# Patient Record
Sex: Female | Born: 1990 | Hispanic: Yes | Marital: Married | Smoking: Never smoker
Health system: Southern US, Community
[De-identification: ages and names within clinical notes are randomized; demographics above are authoritative.]

## PROBLEM LIST (undated history)

## (undated) DIAGNOSIS — D649 Anemia, unspecified: Secondary | ICD-10-CM

## (undated) DIAGNOSIS — Z8759 Personal history of other complications of pregnancy, childbirth and the puerperium: Secondary | ICD-10-CM

## (undated) DIAGNOSIS — Z5189 Encounter for other specified aftercare: Secondary | ICD-10-CM

## (undated) DIAGNOSIS — Z8719 Personal history of other diseases of the digestive system: Secondary | ICD-10-CM

## (undated) HISTORY — DX: Anemia, unspecified: D64.9

## (undated) HISTORY — PX: OTHER SURGICAL HISTORY: SHX169

## (undated) HISTORY — DX: Personal history of other diseases of the digestive system: Z87.19

## (undated) HISTORY — DX: Personal history of other complications of pregnancy, childbirth and the puerperium: Z87.59

## (undated) HISTORY — DX: Encounter for other specified aftercare: Z51.89

---

## 2009-11-15 LAB — OB RESULTS CONSOLE VARICELLA ZOSTER ANTIBODY, IGG: Varicella: IMMUNE

## 2009-11-15 LAB — SICKLE CELL SCREEN: Sickle Cell Screen: NORMAL

## 2009-11-15 LAB — OB RESULTS CONSOLE RUBELLA ANTIBODY, IGM: Rubella: IMMUNE

## 2009-11-18 ENCOUNTER — Ambulatory Visit: Payer: Self-pay | Admitting: Family Medicine

## 2009-12-28 ENCOUNTER — Ambulatory Visit: Payer: Self-pay | Admitting: Family Medicine

## 2010-01-12 ENCOUNTER — Ambulatory Visit: Payer: Self-pay | Admitting: Advanced Practice Midwife

## 2010-05-26 ENCOUNTER — Inpatient Hospital Stay: Payer: Self-pay | Admitting: Obstetrics and Gynecology

## 2015-02-02 ENCOUNTER — Ambulatory Visit
Admission: RE | Admit: 2015-02-02 | Discharge: 2015-02-02 | Disposition: A | Discharge status: Home / Self Care | Payer: PRIVATE HEALTH INSURANCE | Source: Ambulatory Visit | Attending: Family Medicine | Admitting: Family Medicine

## 2015-02-02 ENCOUNTER — Other Ambulatory Visit: Payer: Self-pay | Admitting: Family Medicine

## 2015-02-02 DIAGNOSIS — R7611 Nonspecific reaction to tuberculin skin test without active tuberculosis: Secondary | ICD-10-CM

## 2015-02-02 DIAGNOSIS — J9811 Atelectasis: Secondary | ICD-10-CM | POA: Insufficient documentation

## 2015-02-02 DIAGNOSIS — R918 Other nonspecific abnormal finding of lung field: Secondary | ICD-10-CM | POA: Insufficient documentation

## 2015-02-02 LAB — HM HIV SCREENING LAB: HM HIV Screening: NEGATIVE

## 2015-04-15 DIAGNOSIS — R7611 Nonspecific reaction to tuberculin skin test without active tuberculosis: Secondary | ICD-10-CM | POA: Insufficient documentation

## 2017-07-18 DIAGNOSIS — Z8759 Personal history of other complications of pregnancy, childbirth and the puerperium: Secondary | ICD-10-CM | POA: Insufficient documentation

## 2017-07-18 LAB — HM PAP SMEAR: HM Pap smear: NEGATIVE

## 2019-03-12 ENCOUNTER — Other Ambulatory Visit: Payer: Self-pay

## 2019-03-12 ENCOUNTER — Ambulatory Visit (LOCAL_COMMUNITY_HEALTH_CENTER): Payer: Self-pay

## 2019-03-12 VITALS — BP 107/64 | Ht 61.5 in | Wt 120.0 lb

## 2019-03-12 DIAGNOSIS — Z3201 Encounter for pregnancy test, result positive: Secondary | ICD-10-CM

## 2019-03-12 LAB — PREGNANCY, URINE: Preg Test, Ur: POSITIVE — AB

## 2019-03-12 MED ORDER — PRENATAL VITAMIN 27-0.8 MG PO TABS: 1.0000 | ORAL_TABLET | Freq: Every day | ORAL | 0 refills | Status: AC

## 2019-04-15 ENCOUNTER — Telehealth: Payer: Self-pay

## 2019-04-15 NOTE — Telephone Encounter (Signed)
Via interpreter M. [place]-attempted to call to f/u on +PT and care provider; phone # not working Sharlette Dense, Charity fundraiser

## 2019-04-16 DIAGNOSIS — R7611 Nonspecific reaction to tuberculin skin test without active tuberculosis: Secondary | ICD-10-CM

## 2019-04-16 DIAGNOSIS — Z8759 Personal history of other complications of pregnancy, childbirth and the puerperium: Secondary | ICD-10-CM

## 2019-04-17 ENCOUNTER — Other Ambulatory Visit: Payer: Self-pay

## 2019-04-17 ENCOUNTER — Encounter: Payer: Self-pay | Admitting: Family Medicine

## 2019-04-17 ENCOUNTER — Ambulatory Visit: Payer: Medicaid Other | Admitting: Family Medicine

## 2019-04-17 VITALS — BP 103/42 | HR 82 | Temp 97.0°F | Wt 117.4 lb

## 2019-04-17 DIAGNOSIS — O219 Vomiting of pregnancy, unspecified: Secondary | ICD-10-CM | POA: Insufficient documentation

## 2019-04-17 DIAGNOSIS — Z3480 Encounter for supervision of other normal pregnancy, unspecified trimester: Secondary | ICD-10-CM | POA: Insufficient documentation

## 2019-04-17 DIAGNOSIS — Z9889 Other specified postprocedural states: Secondary | ICD-10-CM

## 2019-04-17 LAB — HIV ANTIBODY (ROUTINE TESTING W REFLEX): HIV 1&2 Ab, 4th Generation: NONREACTIVE

## 2019-04-17 LAB — URINALYSIS
Bilirubin, UA: NEGATIVE
Glucose, UA: NEGATIVE
Ketones, UA: NEGATIVE
Leukocytes,UA: NEGATIVE
Nitrite, UA: NEGATIVE
Protein,UA: NEGATIVE
RBC, UA: NEGATIVE
Specific Gravity, UA: 1.025 (ref 1.005–1.030)
Urobilinogen, Ur: 0.2 mg/dL (ref 0.2–1.0)
pH, UA: 6.5 (ref 5.0–7.5)

## 2019-04-17 LAB — HEMOGLOBIN, FINGERSTICK: Hemoglobin: 11.1 g/dL (ref 11.1–15.9)

## 2019-04-17 MED ORDER — PROMETHAZINE HCL 25 MG PO TABS: 25.0000 mg | ORAL_TABLET | Freq: Four times a day (QID) | ORAL | 2 refills | Status: AC | PRN

## 2019-04-17 NOTE — Progress Notes (Signed)
[place] [id number] [phone]  INITIAL PRENATAL VISIT NOTE  Subjective:  Jessica Savage is a 29 y.o. G3P1011 at [redacted]w[redacted]d being seen today to start prenatal care at the [facility] Department.  She is currently monitored for the following issues for this low-risk pregnancy and has History of postpartum hemorrhage; PPD positive; and Supervision of other normal pregnancy, antepartum on their problem list.  Patient reports no complaints. She is happy about this planned pregnancy. She is nervous because she experienced a prior SAB and is worried this will happen again. She is experiencing significant nausea and vomits about 2-3 times per day  Contractions: Not present. Vag. Bleeding: None.  Movement: Absent. Denies leaking of fluid.   Indications for ASA therapy (per uptodate) One of the following: Previous pregnancy with preeclampsia, especially early onset and with an adverse outcome No Multifetal gestation No Chronic hypertension No Type 1 or 2 diabetes mellitus No Chronic kidney disease No Autoimmune disease (antiphospholipid syndrome, systemic lupus erythematosus) No  Two or more of the following: Nulliparity No Obesity (body mass index >30 kg/m2) No Family history of preeclampsia in mother or sister No Age ?35 years No Sociodemographic characteristics (African American race, low socioeconomic level) Yes Personal risk factors (eg, previous pregnancy with low birth weight or small for gestational age infant, previous adverse pregnancy outcome [eg, stillbirth], interval >10 years between pregnancies) No   The following portions of the patient's history were reviewed and updated as appropriate: allergies, current medications, past family history, past medical history, past social history, past surgical history and problem list. Problem list updated.  Objective:   Vitals:    04/17/19 1320  BP: (!) 103/42  Pulse: 82  Temp: (!) 97 F (36.1 C)  Weight: 117 lb 6.4 oz (53.3 kg)    Fetal Status: Fetal Heart Rate (bpm): 150 Fundal Height: 12 cm Movement: Absent     Physical Exam Vitals and nursing note reviewed.  Constitutional:      General: She is not in acute distress.    Appearance: Normal appearance. She is well-developed.  HENT:     Head: Normocephalic and atraumatic.     Right Ear: External ear normal.     Left Ear: External ear normal.     Nose: Nose normal. No congestion or rhinorrhea.     Mouth/Throat:     Lips: Pink.     Mouth: Mucous membranes are moist.     Dentition: Normal dentition. No dental caries.     Pharynx: Oropharynx is clear. Uvula midline.  Eyes:     General: No scleral icterus.    Conjunctiva/sclera: Conjunctivae normal.  Neck:     Thyroid: No thyroid mass or thyromegaly.  Cardiovascular:     Rate and Rhythm: Normal rate.     Pulses: Normal pulses.     Comments: Extremities are warm and well perfused Pulmonary:     Effort: Pulmonary effort is normal.     Breath sounds: Normal breath sounds.  Chest:     Breasts: Breasts are symmetrical.        Right: Inverted nipple present. No mass, nipple discharge or skin change.        Left: Inverted nipple present. No mass, nipple discharge or skin change.    Abdominal:     General: Abdomen is flat.     Palpations: Abdomen is soft.     Tenderness: There is no abdominal  tenderness.     Comments: Gravid   Genitourinary:    General: Normal vulva.     Exam position: Lithotomy position.     Pubic Area: No rash.      Labia:        Right: No rash.        Left: No rash.      Vagina: Normal. No vaginal discharge.     Cervix: No cervical motion tenderness or friability.     Uterus: Normal. Enlarged (Gravid 12wk size). Not tender.      Adnexa: Right adnexa normal and left adnexa normal.     Rectum: Normal. No external hemorrhoid.  Musculoskeletal:     Right lower leg: No edema.      Left lower leg: No edema.  Lymphadenopathy:     Upper Body:     Right upper body: No axillary adenopathy.     Left upper body: No axillary adenopathy.  Skin:    General: Skin is warm.     Capillary Refill: Capillary refill takes less than 2 seconds.  Neurological:     Mental Status: She is alert.     Assessment and Plan:  Pregnancy: G3P1011 at [redacted]w[redacted]d  1. Supervision of other normal pregnancy, antepartum - Reviewed care at [facility] with team and then delivery at [facility] with local OB practices or [facility].  - Reviewed cadence of care in pregnancy  - reviewed risk factors and ASA not indicated - Pap needed at [facility] visit (after 07/2020) - HIV [place] [facility] - Prenatal profile without Varicella/Rubella (220254) - Urine Culture - Chlamydia/GC NAA, Confirmation - Lead, blood (adult age 60 yrs or greater) - Hemoglobin, venipuncture - Urinalysis (Urine Dip)  Reviewed Centering pregnancy as standard of care at [facility]- not enrolling spanish groups currently  2. Nausea Reviewed OTC medications for nausea Rx for phenergan sent to pharmacy  Preterm labor symptoms and general obstetric precautions including but not limited to vaginal bleeding, contractions, leaking of fluid and fetal movement were reviewed in detail with the patient.  Please refer to After Visit Summary for other counseling recommendations.   Return in about 4 weeks (around 05/15/2019) for Routine prenatal care, in person.  No future appointments.  Federico Flake, MD

## 2019-04-17 NOTE — Progress Notes (Addendum)
Here today for 12.0 week MH IP appt. Taking PNV QD, denies ED/hospital visits since +PT. Declines Flu vaccine. Tawny Hopping, RN

## 2019-04-18 ENCOUNTER — Other Ambulatory Visit: Payer: Self-pay | Admitting: Family Medicine

## 2019-04-18 LAB — CBC/D/PLT+RPR+RH+ABO+AB SCR
Antibody Screen: NEGATIVE
Basophils Absolute: 0 10*3/uL (ref 0.0–0.2)
Basos: 0 %
EOS (ABSOLUTE): 0.1 10*3/uL (ref 0.0–0.4)
Eos: 2 %
Hematocrit: 35 % (ref 34.0–46.6)
Hemoglobin: 11.3 g/dL (ref 11.1–15.9)
Hepatitis B Surface Ag: NEGATIVE
Immature Grans (Abs): 0 10*3/uL (ref 0.0–0.1)
Immature Granulocytes: 0 %
Lymphocytes Absolute: 2 10*3/uL (ref 0.7–3.1)
Lymphs: 35 %
MCH: 29 pg (ref 26.6–33.0)
MCHC: 32.3 g/dL (ref 31.5–35.7)
MCV: 90 fL (ref 79–97)
Monocytes Absolute: 0.4 10*3/uL (ref 0.1–0.9)
Monocytes: 8 %
Neutrophils Absolute: 3.1 10*3/uL (ref 1.4–7.0)
Neutrophils: 55 %
Platelets: 240 10*3/uL (ref 150–450)
RBC: 3.9 x10E6/uL (ref 3.77–5.28)
RDW: 18.2 % — ABNORMAL HIGH (ref 11.7–15.4)
RPR Ser Ql: NONREACTIVE
Rh Factor: POSITIVE
WBC: 5.6 10*3/uL (ref 3.4–10.8)

## 2019-04-18 LAB — LEAD, BLOOD (ADULT >= 16 YRS): Lead-Whole Blood: <1 ug/dL (ref 0–4)

## 2019-04-19 LAB — URINE CULTURE: Organism ID, Bacteria: NO GROWTH

## 2019-04-21 ENCOUNTER — Other Ambulatory Visit: Payer: Self-pay | Admitting: Family Medicine

## 2019-04-21 DIAGNOSIS — Z3689 Encounter for other specified antenatal screening: Secondary | ICD-10-CM

## 2019-04-21 LAB — CHLAMYDIA/GC NAA, CONFIRMATION
Chlamydia trachomatis, NAA: NEGATIVE
Neisseria gonorrhoeae, NAA: NEGATIVE

## 2019-04-23 ENCOUNTER — Telehealth: Payer: Self-pay

## 2019-04-23 NOTE — Telephone Encounter (Signed)
Call to pt via interpreter V. Olmedo; informed of DP GC & u/s appt 05/29/19 @ 9:45 Sharlette Dense, RN

## 2019-04-23 NOTE — Progress Notes (Signed)
HIV results abstracted. Yamaira Spinner, RN  

## 2019-05-14 ENCOUNTER — Ambulatory Visit: Payer: Medicaid Other | Admitting: Advanced Practice Midwife

## 2019-05-14 ENCOUNTER — Other Ambulatory Visit: Payer: Self-pay

## 2019-05-14 DIAGNOSIS — Z3480 Encounter for supervision of other normal pregnancy, unspecified trimester: Secondary | ICD-10-CM

## 2019-05-14 NOTE — Progress Notes (Signed)
   PRENATAL VISIT NOTE  Subjective:  Jessica Savage is a 29 y.o. G3P1011 at [redacted]w[redacted]d being seen today for ongoing prenatal care.  She is currently monitored for the following issues for this low-risk pregnancy and has History of postpartum hemorrhage; PPD positive; Supervision of other normal pregnancy, antepartum; History of breast surgery; and Nausea and vomiting in pregnancy on their problem list.  Patient reports no complaints.  Contractions: Not present. Vag. Bleeding: None.  Movement: Absent. Denies leaking of fluid/ROM.   The following portions of the patient's history were reviewed and updated as appropriate: allergies, current medications, past family history, past medical history, past social history, past surgical history and problem list. Problem list updated.  Objective:   Vitals:   05/14/19 1608  BP: (!) 99/44  Pulse: 76  Temp: (!) 97.3 F (36.3 C)  Weight: 121 lb 9.6 oz (55.2 kg)    Fetal Status:   Fundal Height: 18 cm Movement: Absent     General:  Alert, oriented and cooperative. Patient is in no acute distress.  Skin: Skin is warm and dry. No rash noted.   Cardiovascular: Normal heart rate noted  Respiratory: Normal respiratory effort, no problems with respiration noted  Abdomen: Soft, gravid, appropriate for gestational age.  Pain/Pressure: Absent     Pelvic: Cervical exam deferred        Extremities: Normal range of motion.  Edema: None  Mental Status: Normal mood and affect. Normal behavior. Normal judgment and thought content.   Assessment and Plan:  Pregnancy: G3P1011 at [redacted]w[redacted]d  1. Supervision of other normal pregnancy, antepartum Feels well.  Here with FOB and daughter.  Working 40 hrs/wk.  Desires Quad screen today.  Reminded of GC and u/s at [facility] on 05/29/19. - QUAD Screen [facility] Only   Preterm labor symptoms and general obstetric precautions including but not limited to vaginal bleeding, contractions, leaking of fluid and fetal movement were  reviewed in detail with the patient. Please refer to After Visit Summary for other counseling recommendations.  No follow-ups on file.  Future Appointments  Date Time Provider Department Center  05/29/2019 10:00 AM [facility] None  05/29/2019 11:00 AM [facility] [place] [facility]    Alberteen Spindle, CNM

## 2019-05-14 NOTE — Progress Notes (Signed)
In for visit; denies hospital visits since last appt; taking PNV; aware of 05/29/19 [facility] appts; discussed Quad screen-desires today Sharlette Dense, RN

## 2019-05-29 ENCOUNTER — Other Ambulatory Visit: Payer: Self-pay

## 2019-05-29 ENCOUNTER — Ambulatory Visit
Admission: RE | Admit: 2019-05-29 | Discharge: 2019-05-29 | Disposition: A | Discharge status: Home / Self Care | Payer: BC Managed Care – PPO | Source: Ambulatory Visit | Attending: Family Medicine | Admitting: Family Medicine

## 2019-05-29 ENCOUNTER — Ambulatory Visit (HOSPITAL_BASED_OUTPATIENT_CLINIC_OR_DEPARTMENT_OTHER)
Admission: RE | Admit: 2019-05-29 | Discharge: 2019-05-29 | Disposition: A | Discharge status: Home / Self Care | Payer: BC Managed Care – PPO | Source: Ambulatory Visit | Attending: Obstetrics and Gynecology | Admitting: Obstetrics and Gynecology

## 2019-05-29 VITALS — BP 101/59 | HR 96 | Temp 97.9°F | Wt 124.0 lb

## 2019-05-29 DIAGNOSIS — Z3A18 18 weeks gestation of pregnancy: Secondary | ICD-10-CM | POA: Diagnosis not present

## 2019-05-29 DIAGNOSIS — Z3689 Encounter for other specified antenatal screening: Secondary | ICD-10-CM | POA: Diagnosis not present

## 2019-05-29 DIAGNOSIS — Z36 Encounter for antenatal screening for chromosomal anomalies: Secondary | ICD-10-CM | POA: Diagnosis not present

## 2019-05-29 DIAGNOSIS — O43199 Other malformation of placenta, unspecified trimester: Secondary | ICD-10-CM | POA: Insufficient documentation

## 2019-05-29 NOTE — Progress Notes (Signed)
Referring physician:  [facility] Department Length of Consultation: 20 minutes   Ms. Jessica Savage  was referred to [place] for genetic counseling to review prenatal screening and testing options.  This note summarizes the information we discussed.    We discussed the following routine screening tests for this pregnancy:  Maternal serum marker screening, a blood test that measures pregnancy proteins, can provide risk assessments for Down syndrome, trisomy 18, and open neural tube defects (spina bifida, anencephaly). Because it does not directly examine the fetus, it cannot positively diagnose or rule out these problems. Maternal serum screening was previously performed at [facility] and is within normal range.  The chance for Down syndrome was reduced to 1 in 2,140, the chance for open spina bifida is 1 in 5,320 and the chance for trisomy 18 was not increased.  Targeted ultrasound uses high frequency sound waves to create an image of the developing fetus.  An ultrasound is often recommended as a routine means of evaluating the pregnancy.  It is also used to screen for fetal anatomy problems (for example, a heart defect) that might be suggestive of a chromosomal or other abnormality. An ultrasound at the time of this visit showed normal fetal anatomy at [redacted] weeks gestation. See that note for details.  Should these screening tests indicate an increased concern, then the following additional testing options would be offered:  Amniocentesis involves the removal of a small amount of amniotic fluid from the sac surrounding the fetus with the use of a thin needle inserted through the maternal abdomen and uterus.  Ultrasound guidance is used throughout the procedure.  Fetal cells from amniotic fluid are directly evaluated and > 99.5% of chromosome problems and > 98% of open neural tube defects can be detected. This procedure is generally performed after the 15th week of  pregnancy.  The main risks to this procedure include complications leading to miscarriage in less than 1 in 200 cases (0.5%).  As another option for information if the pregnancy is suspected to be an an increased chance for certain chromosome conditions, we also reviewed the availability of cell free fetal DNA testing from maternal blood to determine whether or not the baby may have either Down syndrome, trisomy 80, or trisomy 11.  This test utilizes a maternal blood sample and DNA sequencing technology to isolate circulating cell free fetal DNA from maternal plasma.  The fetal DNA can then be analyzed for DNA sequences that are derived from the three most common chromosomes involved in aneuploidy, chromosomes 13, 18, and 21.  If the overall amount of DNA is greater than the expected level for any of these chromosomes, aneuploidy is suspected.  While we do not consider it a replacement for invasive testing and karyotype analysis, a negative result from this testing would be reassuring, though not a guarantee of a normal chromosome complement for the baby.  An abnormal result is certainly suggestive of an abnormal chromosome complement, though we would still recommend CVS or amniocentesis to confirm any findings from this testing.  Cystic Fibrosis and Spinal Muscular Atrophy (SMA) screening were also discussed with the patient. Both conditions are recessive, which means that both parents must be carriers in order to have a child with the disease.  Cystic fibrosis (CF) is one of the most common genetic conditions in persons of Caucasian ancestry.  This condition occurs in approximately 1 in 2,500 Caucasian persons and results in thickened secretions in the lungs, digestive,  and reproductive systems.  For a baby to be at risk for having CF, both of the parents must be carriers for this condition.  Approximately 1 in 38 Caucasian persons is a carrier for CF.  Current carrier testing looks for the most common  mutations in the gene for CF and can detect approximately 90% of carriers in the Caucasian population.  This means that the carrier screening can greatly reduce, but cannot eliminate, the chance for an individual to have a child with CF.  If an individual is found to be a carrier for CF, then carrier testing would be available for the partner. As part of [place]'s newborn screening profile, all babies born in the state of [place] will have a two-tier screening process.  Specimens are first tested to determine the concentration of immunoreactive trypsinogen (IRT).  The top 5% of specimens with the highest IRT values then undergo DNA testing using a panel of over 40 common CF mutations. SMA is a neurodegenerative disorder that leads to atrophy of skeletal muscle and overall weakness.  This condition is also more prevalent in the Caucasian population, with 1 in 40-1 in 60 persons being a carrier and 1 in 6,000-1 in 10,000 children being affected.  There are multiple forms of the disease, with some causing death in infancy to other forms with survival into adulthood.  The genetics of SMA is complex, but carrier screening can detect up to 95% of carriers in the Caucasian population.  Similar to CF, a negative result can greatly reduce, but cannot eliminate, the chance to have a child with SMA. Hemoglobinopathy screening was also made available.  We inquire about the family history and pregnancy history.  The patient reported no complications or exposures in this pregnancy to medications, alcohol, tobacco or recreational drugs.  The family history was reported to be unremarkable for birth defects, intellectual delays, recurrent pregnancy loss or known chromosome abnormalities.  After consideration of the options, Ms. Jessica Savage declined carrier screening for CF, SMA and hemoglobinopathies.  Ms. Jessica Savage was encouraged to call with questions or concerns.  We can be contacted at [phone].  Labs ordered: none  Cherly Anderson, MS, CGC

## 2019-06-02 ENCOUNTER — Encounter: Payer: Self-pay | Admitting: Family Medicine

## 2019-06-02 DIAGNOSIS — Z3480 Encounter for supervision of other normal pregnancy, unspecified trimester: Secondary | ICD-10-CM

## 2019-06-11 ENCOUNTER — Other Ambulatory Visit: Payer: Self-pay

## 2019-06-11 ENCOUNTER — Ambulatory Visit: Payer: Medicaid Other | Admitting: Advanced Practice Midwife

## 2019-06-11 VITALS — BP 107/64 | HR 86 | Temp 97.8°F | Wt 127.8 lb

## 2019-06-11 DIAGNOSIS — O219 Vomiting of pregnancy, unspecified: Secondary | ICD-10-CM

## 2019-06-11 DIAGNOSIS — O43199 Other malformation of placenta, unspecified trimester: Secondary | ICD-10-CM

## 2019-06-11 DIAGNOSIS — Z3480 Encounter for supervision of other normal pregnancy, unspecified trimester: Secondary | ICD-10-CM

## 2019-06-11 NOTE — Progress Notes (Signed)
Here today for 19.6 week MH RV. Taking PNV QD and needs Rx sent for same sent to pharmacy in chart. Denies ED/hosptial visits since last RV. Tawny Hopping, RN

## 2019-06-11 NOTE — Progress Notes (Signed)
   PRENATAL VISIT NOTE  Subjective:  Jessica Savage is a 29 y.o. G3P1011 at [redacted]w[redacted]d being seen today for ongoing prenatal care.  She is currently monitored for the following issues for this low-risk pregnancy and has History of postpartum hemorrhage; PPD positive; Supervision of other normal pregnancy, antepartum; History of breast surgery; Nausea and vomiting in pregnancy; Encounter for antenatal screening for chromosomal anomalies; and Marginal insertion of umbilical cord affecting management of mother on their problem list.  Patient reports no complaints.  Contractions: Not present. Vag. Bleeding: None.  Movement: Present. Denies leaking of fluid/ROM.   The following portions of the patient's history were reviewed and updated as appropriate: allergies, current medications, past family history, past medical history, past social history, past surgical history and problem list. Problem list updated.  Objective:   Vitals:   06/11/19 1612  BP: 107/64  Pulse: 86  Temp: 97.8 F (36.6 C)  Weight: 127 lb 12.8 oz (58 kg)    Fetal Status: Fetal Heart Rate (bpm): 140 Fundal Height: 20 cm Movement: Present     General:  Alert, oriented and cooperative. Patient is in no acute distress.  Skin: Skin is warm and dry. No rash noted.   Cardiovascular: Normal heart rate noted  Respiratory: Normal respiratory effort, no problems with respiration noted  Abdomen: Soft, gravid, appropriate for gestational age.  Pain/Pressure: Absent     Pelvic: Cervical exam deferred        Extremities: Normal range of motion.  Edema: None  Mental Status: Normal mood and affect. Normal behavior. Normal judgment and thought content.   Assessment and Plan:  Pregnancy: G3P1011 at [redacted]w[redacted]d  1. Supervision of other normal pregnancy, antepartum Quad screen from 05/14/19 neg.  Working 40 hrs/wk.  Here with FOB and daughter.   States she doesn't need to exercise because she is active at [facility] [employer] [facility] [employer] needs to exercise 4x/wk  x 20-30 min Pt now changes mind and wants to deliver at [facility]  2. Marginal insertion of umbilical cord affecting management of mother Reviewed 05/29/19 u/s with normal anatomy, AFI wnl, EFW=63%,  No previa, posterior placenta, marginal cord insertion at fundal portion of placenta.  Recommend third trimester growth u/s  3. Nausea and vomiting in pregnancy Pt states still has N&V and taking Phenergan qhs.  Last vomited 3 wks ago.  7 lb 12.8 oz (3.538 kg)  Counseled pt to eat high protein snacks q 2 hours and try ginger tea/chamomile tea at night instead of Phenergan.       Preterm labor symptoms and general obstetric precautions including but not limited to vaginal bleeding, contractions, leaking of fluid and fetal movement were reviewed in detail with the patient. Please refer to After Visit Summary for other counseling recommendations.  Return in about 4 weeks (around 07/09/2019) for routine PNC.  Future Appointments  Date Time Provider Department Center  07/10/2019  4:00 PM [facility] PROVIDER [facility] None  08/21/2019  9:00 AM [facility] [place] [facility]    Alberteen Spindle, CNM

## 2019-07-10 ENCOUNTER — Other Ambulatory Visit: Payer: Self-pay

## 2019-07-10 ENCOUNTER — Ambulatory Visit: Payer: BC Managed Care – PPO | Admitting: Physician Assistant

## 2019-07-10 VITALS — BP 94/56 | HR 82 | Temp 97.6°F | Wt 135.6 lb

## 2019-07-10 DIAGNOSIS — O43199 Other malformation of placenta, unspecified trimester: Secondary | ICD-10-CM

## 2019-07-10 DIAGNOSIS — Z3480 Encounter for supervision of other normal pregnancy, unspecified trimester: Secondary | ICD-10-CM

## 2019-07-10 NOTE — Progress Notes (Signed)
   PRENATAL VISIT NOTE  Subjective:  Jessica Savage is a 29 y.o. G3P1011 at [redacted]w[redacted]d being seen today for ongoing prenatal care.  She is currently monitored for the following issues for this low-risk pregnancy and has History of postpartum hemorrhage; PPD positive; Supervision of other normal pregnancy, antepartum; History of breast surgery; Nausea and vomiting in pregnancy; Encounter for antenatal screening for chromosomal anomalies; and Marginal insertion of umbilical cord affecting management of mother on their problem list.  Patient reports no complaints.  Contractions: Not present. Vag. Bleeding: None.  Movement: Present. Denies leaking of fluid/ROM.   The following portions of the patient's history were reviewed and updated as appropriate: allergies, current medications, past family history, past medical history, past social history, past surgical history and problem list. Problem list updated.  Objective:   Vitals:   07/10/19 1621  BP: (!) 94/56  Pulse: 82  Temp: 97.6 F (36.4 C)  Weight: 135 lb 9.6 oz (61.5 kg)    Fetal Status: Fetal Heart Rate (bpm): 140 Fundal Height: 25 cm Movement: Present     General:  Alert, oriented and cooperative. Patient is in no acute distress.  Skin: Skin is warm and dry. No rash noted.   Cardiovascular: Normal heart rate noted  Respiratory: Normal respiratory effort, no problems with respiration noted  Abdomen: Soft, gravid, appropriate for gestational age.  Pain/Pressure: Absent     Pelvic: Cervical exam deferred        Extremities: Normal range of motion.  Edema: None  Mental Status: Normal mood and affect. Normal behavior. Normal judgment and thought content.   Assessment and Plan:  Pregnancy: G3P1011 at [redacted]w[redacted]d  1. Supervision of other normal pregnancy, antepartum Anticipatory guidance re: 28-wk labs at RV.  2. Marginal insertion of umbilical cord affecting management of mother Reviewed 05/29/19 [place] result with pt/husb. Plan to repeat US  for fetal growth in third trim. No indication for work restrictions at present (lifts 15 lb at work.). Note family planning 1-mo visit to family in [place] by car during pregnancy. Enc to do so by first part of 3rd trim, can give paper copy of prenatal record to take on trip.   Preterm labor symptoms and general obstetric precautions including but not limited to vaginal bleeding, contractions, leaking of fluid and fetal movement were reviewed in detail with the patient. Please refer to After Visit Summary for other counseling recommendations.  Return in about 3 weeks (around 07/31/2019) for Routine prenatal care, 28 wk labs (needs visit before 10:30am or 3pm).  Future Appointments  Date Time Provider Department Center  07/31/2019  4:00 PM [facility] PROVIDER [facility] None  08/21/2019  9:00 AM [facility] [place] [facility]    Annamarie Streilein, PA-C

## 2019-07-15 ENCOUNTER — Other Ambulatory Visit: Payer: Self-pay

## 2019-07-15 ENCOUNTER — Ambulatory Visit: Payer: BC Managed Care – PPO | Admitting: Family Medicine

## 2019-07-15 VITALS — BP 104/65 | HR 87 | Temp 98.3°F | Wt 136.2 lb

## 2019-07-15 DIAGNOSIS — Z3480 Encounter for supervision of other normal pregnancy, unspecified trimester: Secondary | ICD-10-CM

## 2019-07-15 DIAGNOSIS — L299 Pruritus, unspecified: Secondary | ICD-10-CM

## 2019-07-15 MED ORDER — PERMETHRIN 5 % EX CREA: TOPICAL_CREAM | CUTANEOUS | 1 refills | Status: AC

## 2019-07-15 NOTE — Progress Notes (Signed)
Walked in today at 24.[redacted] weeks gestation. Would like to discuss with provider concerns of itching all over her body for 4 weeks. Taking PNV QD. Denies ED/hospital visits since last RV of 07/10/2019. Next RV appt scheduled for 07/31/19. Tawny Hopping, RN

## 2019-07-15 NOTE — Progress Notes (Signed)
°  PRENATAL VISIT NOTE  Subjective:  Jessica Savage is a 29 y.o. G3P1011 at [redacted]w[redacted]d being seen today for ongoing prenatal care.  She is currently monitored for the following issues for this low-risk pregnancy and has History of postpartum hemorrhage; PPD positive; Supervision of other normal pregnancy, antepartum; History of breast surgery; Nausea and vomiting in pregnancy; Encounter for antenatal screening for chromosomal anomalies; and Marginal insertion of umbilical cord affecting management of mother on their problem list.  Pt presents for walk-in visit d/t itching x4 wks. She has noticed small lesions where she itches frequently. Worse on extremities, none on abdomen. Worse in daytime. She shares a bed with husband and daughter, daughter has one area on leg that also itches. She forgot to mention this at last visit. Reports good fetal mov't.    Contractions: Not present. Vag. Bleeding: None.  Movement: Present. Denies leaking of fluid/ROM.   The following portions of the patient's history were reviewed and updated as appropriate: allergies, current medications, past family history, past medical history, past social history, past surgical history and problem list. Problem list updated.  Objective:   Vitals:   07/15/19 1634  BP: 104/65  Pulse: 87  Temp: 98.3 F (36.8 C)  Weight: 136 lb 3.2 oz (61.8 kg)    Fetal Status: Fetal Heart Rate (bpm): 145 Fundal Height: 25 cm Movement: Present     General:  Alert, oriented and cooperative. Patient is in no acute distress.  Skin: Skin is warm and dry. +scattered pinpoint lesions on arms and legs. +signs of excoriation. No erythema or other rashes, no jaundice. No sclera icterus.  Cardiovascular: Normal heart rate noted  Respiratory: Normal respiratory effort, no problems with respiration noted  Abdomen: Soft, gravid, appropriate for gestational age.  Pain/Pressure: Absent     Pelvic: Cervical exam deferred        Extremities: Normal range of  motion.  Edema: None  Mental Status: Normal mood and affect. Normal behavior. Normal judgment and thought content.   Assessment and Plan:  Pregnancy: G3P1011 at [redacted]w[redacted]d  1. Itching -Pinpoint lesions suggestive of scabies. Rx permethrin, advised to use tomorrow. If no improvement or if symptoms worsen to RTC for further investigation. - permethrin (ELIMITE) 5 % cream; Apply from neck down to the soles of the feet, wash off after 8-14 hours. If symptoms improve but fail to resolve completely repeat after 10 days.  Dispense: 60 g; Refill: 1   Preterm labor symptoms and general obstetric precautions including but not limited to vaginal bleeding, contractions, leaking of fluid and fetal movement were reviewed in detail with the patient. Please refer to After Visit Summary for other counseling recommendations.  Return in about 2 weeks (around 07/29/2019) for routine prenatal care as regularly scheduled.  Future Appointments  Date Time Provider Department Center  07/31/2019  4:00 PM [facility] PROVIDER [facility] None  08/21/2019  9:00 AM [facility] [place] [facility]    Ann Held, PA-C

## 2019-07-16 ENCOUNTER — Other Ambulatory Visit: Payer: Self-pay

## 2019-07-16 ENCOUNTER — Encounter: Payer: Self-pay | Admitting: Family Medicine

## 2019-07-31 ENCOUNTER — Ambulatory Visit: Payer: Self-pay

## 2019-07-31 ENCOUNTER — Telehealth: Payer: Self-pay

## 2019-07-31 NOTE — Telephone Encounter (Signed)
Attempted to call via interpreter [phone] to reschedule from today due to provider illness & will also need an earlier time due to labs needed @ visit; no answer, left voicemail message via interpreter Sharlette Dense, RN

## 2019-08-01 ENCOUNTER — Ambulatory Visit: Payer: BC Managed Care – PPO

## 2019-08-04 ENCOUNTER — Ambulatory Visit: Payer: BC Managed Care – PPO

## 2019-08-14 ENCOUNTER — Other Ambulatory Visit: Payer: Self-pay | Admitting: Family Medicine

## 2019-08-14 DIAGNOSIS — O43199 Other malformation of placenta, unspecified trimester: Secondary | ICD-10-CM

## 2019-08-21 ENCOUNTER — Inpatient Hospital Stay
Admission: RE | Admit: 2019-08-21 | Discharge: 2019-08-21 | Disposition: A | Discharge status: Home / Self Care | Payer: PRIVATE HEALTH INSURANCE | Source: Ambulatory Visit

## 2019-08-21 NOTE — Progress Notes (Signed)
Pt was a "No Show" at Maternal Fetal Care at [facility] today.

## 2019-08-26 ENCOUNTER — Telehealth: Payer: Self-pay

## 2019-08-26 NOTE — Telephone Encounter (Signed)
Attempted to call via interpreter M. [place]-client came in to [facility] 07/31/19 and picked up copy of records to "go out of the country"; attempted to call to inquire if will be returning and to schedule follow-up; no answer, left voicemail message Sharlette Dense, RN

## 2019-08-28 NOTE — Telephone Encounter (Signed)
Attempt x 2 to reach client, via interpreter  V. Olmedo-left voicemail message Sharlette Dense, RN

## 2019-09-15 ENCOUNTER — Telehealth: Payer: Self-pay

## 2019-09-15 NOTE — Telephone Encounter (Signed)
Attempted to call via interpreter M. [place]-to inquire if has transferred care; left voicemail message at client # and at # for "spouse"; Sharlette Dense, RN

## 2019-09-22 ENCOUNTER — Other Ambulatory Visit: Payer: Self-pay

## 2019-09-22 ENCOUNTER — Ambulatory Visit: Payer: BC Managed Care – PPO | Admitting: Family Medicine

## 2019-09-22 VITALS — BP 111/63 | HR 78 | Temp 97.1°F | Wt 145.2 lb

## 2019-09-22 DIAGNOSIS — Z789 Other specified health status: Secondary | ICD-10-CM

## 2019-09-22 DIAGNOSIS — Z3480 Encounter for supervision of other normal pregnancy, unspecified trimester: Secondary | ICD-10-CM

## 2019-09-22 DIAGNOSIS — L299 Pruritus, unspecified: Secondary | ICD-10-CM

## 2019-09-22 DIAGNOSIS — Z8759 Personal history of other complications of pregnancy, childbirth and the puerperium: Secondary | ICD-10-CM

## 2019-09-22 NOTE — Progress Notes (Signed)
In for routine MH RV at 34.4 weeks. Last seen at 24.5 weeks. Traveled to [place] from June 18-July 24. Reports receiving one U/S in [place]. Takes PNV daily. Needs prescription for PNV. Denies ED/Hospital visit since last RV. Reports swelling in hands and feet x2 weeks. Reports genital itching x several months.Has 11:42 lab collect. Sharlyne Pacas, RN

## 2019-09-22 NOTE — Progress Notes (Signed)
  PRENATAL VISIT NOTE  Subjective:  Jessica Savage is a 29 y.o. G3P1011 at [redacted]w[redacted]d being seen today for ongoing prenatal care.  She is currently monitored for the following issues for this low-risk pregnancy and has History of postpartum hemorrhage; PPD positive; Supervision of other normal pregnancy, antepartum; History of breast surgery; Nausea and vomiting in pregnancy; Encounter for antenatal screening for chromosomal anomalies; and Marginal insertion of umbilical cord affecting management of mother on their problem list.  Patient reports itching in hands and feet with some reported swelling in both places. .  Contractions: Not present. Vag. Bleeding: None.  Movement: Present. Denies leaking of fluid/ROM.   The following portions of the patient's history were reviewed and updated as appropriate: allergies, current medications, past family history, past medical history, past social history, past surgical history and problem list. Problem list updated.  Objective:   Vitals:   09/22/19 1039  BP: 111/63  Pulse: 78  Temp: (!) 97.1 F (36.2 C)  Weight: 145 lb 3.2 oz (65.9 kg)    Fetal Status: Fetal Heart Rate (bpm): 145 Fundal Height: 34 cm Movement: Present  Presentation: Vertex  General:  Alert, oriented and cooperative. Patient is in no acute distress.  Skin: Skin is warm and dry. No rash noted.   Cardiovascular: Normal heart rate noted  Respiratory: Normal respiratory effort, no problems with respiration noted  Abdomen: Soft, gravid, appropriate for gestational age.  Pain/Pressure: Absent     Pelvic: Cervical exam deferred        Extremities: Normal range of motion.  Edema: None  Mental Status: Normal mood and affect. Normal behavior. Normal judgment and thought content.   Assessment and Plan:  Pregnancy: G3P1011 at [redacted]w[redacted]d  1. Supervision of other normal pregnancy, antepartum Up to date otherwise Has had extended international travel-- hence screening for TB and Lead -  Glucose, 1 hour gestational - HIV Antibody (routine testing w rflx) - RPR - QuantiFERON-TB Gold Plus - Lead, blood (adult age 9 yrs or greater)  2. History of recent travel - QuantiFERON-TB Gold Plus - Lead, blood (adult age 64 yrs or greater)  3. History of postpartum hemorrhage LD aware.    Preterm labor symptoms and general obstetric precautions including but not limited to vaginal bleeding, contractions, leaking of fluid and fetal movement were reviewed in detail with the patient. Please refer to After Visit Summary for other counseling recommendations.   Return in about 2 weeks (around 10/06/2019) for Routine prenatal care.  No future appointments.  Federico Flake, MD

## 2019-09-23 LAB — LEAD, BLOOD (ADULT >= 16 YRS): Lead-Whole Blood: 4 ug/dL (ref 0–4)

## 2019-09-23 LAB — HEMOGLOBIN, FINGERSTICK: Hemoglobin: 13.2 g/dL (ref 11.1–15.9)

## 2019-09-24 LAB — HIV ANTIBODY (ROUTINE TESTING W REFLEX): HIV Screen 4th Generation wRfx: NONREACTIVE

## 2019-09-24 LAB — QUANTIFERON-TB GOLD PLUS
QuantiFERON Mitogen Value: 9.83 IU/mL
QuantiFERON Nil Value: 0.03 IU/mL
QuantiFERON TB1 Ag Value: 1.3 IU/mL
QuantiFERON TB2 Ag Value: 1.71 IU/mL
QuantiFERON-TB Gold Plus: POSITIVE — AB

## 2019-09-24 LAB — GLUCOSE, 1 HOUR GESTATIONAL: Gestational Diabetes Screen: 130 mg/dL (ref 65–139)

## 2019-09-24 LAB — RPR: RPR Ser Ql: NONREACTIVE

## 2019-09-26 ENCOUNTER — Encounter: Payer: Self-pay | Admitting: Family Medicine

## 2019-10-02 ENCOUNTER — Telehealth: Payer: Self-pay

## 2019-10-02 NOTE — Telephone Encounter (Signed)
Agree with plan of care per nursing note.

## 2019-10-02 NOTE — Telephone Encounter (Signed)
Call to client to f/u call to [facility] 09/24/19; per 09/24/19 note-reported s/s possible Covid and was advised to get tested; Client states she did not get a test due to not having any of the noted symptoms after 09/24/19.  Has not taken Tylenol since 09/24/19 and denies any fever, sore throat, body aches, HA, loss of smell/taste.  States only had congestion, mild HA and body ache x 1 day and drank a "tea" which caused the sweating, not a fever.  Denies any known exposure to Covid and has never been tested.  States feeling well & good FM;  Reiterated with any s/s of Covid, very important to be tested.  To call with problems/?'s Sharlette Dense, RN (interpreted by V. Olmedo)

## 2019-10-06 ENCOUNTER — Other Ambulatory Visit: Payer: Self-pay

## 2019-10-06 ENCOUNTER — Ambulatory Visit: Payer: BC Managed Care – PPO | Admitting: Advanced Practice Midwife

## 2019-10-06 VITALS — BP 106/61 | HR 79 | Temp 97.8°F | Wt 152.6 lb

## 2019-10-06 DIAGNOSIS — O43199 Other malformation of placenta, unspecified trimester: Secondary | ICD-10-CM

## 2019-10-06 DIAGNOSIS — Z3480 Encounter for supervision of other normal pregnancy, unspecified trimester: Secondary | ICD-10-CM

## 2019-10-06 DIAGNOSIS — Z9119 Patient's noncompliance with other medical treatment and regimen: Secondary | ICD-10-CM

## 2019-10-06 DIAGNOSIS — Z91199 Patient's noncompliance with other medical treatment and regimen due to unspecified reason: Secondary | ICD-10-CM | POA: Insufficient documentation

## 2019-10-06 LAB — WET PREP FOR TRICH, YEAST, CLUE
Trichomonas Exam: NEGATIVE
Yeast Exam: NEGATIVE

## 2019-10-06 NOTE — Progress Notes (Signed)
   PRENATAL VISIT NOTE  Subjective:  Jessica Savage is a 29 y.o. G3P1011 at [redacted]w[redacted]d being seen today for ongoing prenatal care.  She is currently monitored for the following issues for this low-risk pregnancy and has History of postpartum hemorrhage 05/26/10; PPD positive 2017; Supervision of other normal pregnancy, antepartum; History of breast surgery 2015, 2016; Nausea and vomiting in pregnancy; Encounter for antenatal screening for chromosomal anomalies; Marginal insertion of umbilical cord affecting management of mother; and Noncompliance with prenatal care--no care from 07/15/19-09/22/19 (10 weeks) on their problem list.  Patient reports edema.  Contractions: Not present. Vag. Bleeding: None.  Movement: Present. Denies leaking of fluid/ROM.   The following portions of the patient's history were reviewed and updated as appropriate: allergies, current medications, past family history, past medical history, past social history, past surgical history and problem list. Problem list updated.  Objective:   Vitals:   10/06/19 1533  BP: 106/61  Pulse: 79  Temp: 97.8 F (36.6 C)  Weight: 152 lb 9.6 oz (69.2 kg)    Fetal Status: Fetal Heart Rate (bpm): 130 Fundal Height: 36 cm Movement: Present  Presentation: Vertex  General:  Alert, oriented and cooperative. Patient is in no acute distress.  Skin: Skin is warm and dry. No rash noted.   Cardiovascular: Normal heart rate noted  Respiratory: Normal respiratory effort, no problems with respiration noted  Abdomen: Soft, gravid, appropriate for gestational age.  Pain/Pressure: Absent     Pelvic: Cervical exam deferred        Extremities: Normal range of motion.  Edema: Mild pitting, slight indentation  Mental Status: Normal mood and affect. Normal behavior. Normal judgment and thought content.   Assessment and Plan:  Pregnancy: G3P1011 at [redacted]w[redacted]d  1. Marginal insertion of umbilical cord affecting management of mother dx'd with 05/29/19 u/s with  marginal cord insertion and plan to do growth u/s in third trimester.  U/S order faxed to [facility] and refaxed second time. Growth u/s 10/10/19.     2. Supervision of other normal pregnancy, antepartum Working 40 hrs/wk. C/o feet edema--suggestions given. (rice with water for breakfast this am (1620 now) and 1 hotdog for dinner last night.) Wet mount done for external and internal vaginal itching GBS/GC/Chlamydia cultures done Knows when to go to L&D - Chlamydia/GC NAA, Confirmation - Culture, beta strep (group b only) - WET PREP FOR TRICH, YEAST, CLUE  3. Noncompliance with prenatal care--no care from 6/    Preterm labor symptoms and general obstetric precautions including but not limited to vaginal bleeding, contractions, leaking of fluid and fetal movement were reviewed in detail with the patient. Please refer to After Visit Summary for other counseling recommendations.  Return in about 1 week (around 10/13/2019) for routine PNC.  No future appointments.  Alberteen Spindle, CNM

## 2019-10-06 NOTE — Progress Notes (Signed)
Wet mount reviewed, no treatment indicated. Patient and partner desire covid 19 vaccine and plan to walk-in this afternoon. Covid information card given.Burt Knack, RN

## 2019-10-06 NOTE — Progress Notes (Addendum)
Patient here for MH RV at 73 4/7 with partner and daughter. 36 week labs and packet today. Patient prefers to have provider collect labs today. Patient aware of [facility] U/S on 10/10/2019 at 1pm..Marland KitchenBurt Knack, RN

## 2019-10-07 ENCOUNTER — Other Ambulatory Visit: Payer: Self-pay | Admitting: Family Medicine

## 2019-10-07 ENCOUNTER — Ambulatory Visit (LOCAL_COMMUNITY_HEALTH_CENTER): Payer: BC Managed Care – PPO

## 2019-10-07 VITALS — Wt 152.0 lb

## 2019-10-07 DIAGNOSIS — R7612 Nonspecific reaction to cell mediated immunity measurement of gamma interferon antigen response without active tuberculosis: Secondary | ICD-10-CM

## 2019-10-07 NOTE — Progress Notes (Signed)
Patient had +QFT in maternity clinic. Has hx of +PPD 11/18/2009 and abnormal CXR on 02/02/2015. Patient also had negative sputum cultures x 3 01/2015. Completed LTBI tx (Rifampin 03/19/15-07/30/15).   Per Dr. Alvester Morin QFT result note, patient needs TBS and CXR d/t a recent possible  re-exposure (trip to [place] for 1 months span) ~ 1 month ago.   TC to patient with interpreter V. Olmedo  Denies sx's of TB and CXR ordered. TB RN will f/u with patient once results are complete. Richmond Campbell, RN

## 2019-10-08 LAB — CHLAMYDIA/GC NAA, CONFIRMATION
Chlamydia trachomatis, NAA: NEGATIVE
Neisseria gonorrhoeae, NAA: NEGATIVE

## 2019-10-10 ENCOUNTER — Ambulatory Visit
Admission: RE | Admit: 2019-10-10 | Discharge: 2019-10-10 | Disposition: A | Discharge status: Home / Self Care | Payer: PRIVATE HEALTH INSURANCE | Source: Ambulatory Visit | Attending: Family Medicine | Admitting: Family Medicine

## 2019-10-10 ENCOUNTER — Ambulatory Visit
Admission: RE | Admit: 2019-10-10 | Discharge: 2019-10-10 | Disposition: A | Discharge status: Home / Self Care | Payer: PRIVATE HEALTH INSURANCE | Attending: Family Medicine | Admitting: Family Medicine

## 2019-10-10 DIAGNOSIS — R7612 Nonspecific reaction to cell mediated immunity measurement of gamma interferon antigen response without active tuberculosis: Secondary | ICD-10-CM

## 2019-10-10 DIAGNOSIS — O365990 Maternal care for other known or suspected poor fetal growth, unspecified trimester, not applicable or unspecified: Secondary | ICD-10-CM | POA: Insufficient documentation

## 2019-10-10 LAB — CULTURE, BETA STREP (GROUP B ONLY): Strep Gp B Culture: NEGATIVE

## 2019-10-13 ENCOUNTER — Other Ambulatory Visit: Payer: Self-pay

## 2019-10-13 ENCOUNTER — Ambulatory Visit: Payer: BC Managed Care – PPO | Admitting: Family Medicine

## 2019-10-13 ENCOUNTER — Encounter: Payer: Self-pay | Admitting: Family Medicine

## 2019-10-13 VITALS — BP 108/62 | HR 77 | Temp 97.4°F | Wt 154.2 lb

## 2019-10-13 DIAGNOSIS — Z8759 Personal history of other complications of pregnancy, childbirth and the puerperium: Secondary | ICD-10-CM

## 2019-10-13 DIAGNOSIS — O43199 Other malformation of placenta, unspecified trimester: Secondary | ICD-10-CM

## 2019-10-13 DIAGNOSIS — O365990 Maternal care for other known or suspected poor fetal growth, unspecified trimester, not applicable or unspecified: Secondary | ICD-10-CM

## 2019-10-13 DIAGNOSIS — Z3480 Encounter for supervision of other normal pregnancy, unspecified trimester: Secondary | ICD-10-CM

## 2019-10-13 NOTE — Progress Notes (Signed)
PRENATAL VISIT NOTE  Subjective:  Jessica Savage is a 29 y.o. G3P1011 at 47w4dbeing seen today for ongoing prenatal care.  She is currently monitored for the following issues for this high-risk pregnancy and has History of postpartum hemorrhage 05/26/10; PPD positive 2017; Supervision of other normal pregnancy, antepartum; History of breast surgery 2015, 2016; Nausea and vomiting in pregnancy; Encounter for antenatal screening for chromosomal anomalies; Marginal insertion of umbilical cord affecting management of mother; Noncompliance with prenatal care--no care from 07/15/19-09/22/19 (10 weeks); and Pregnancy affected by fetal growth restriction on their problem list.  Patient reports no complaints.  Contractions: Not present. Vag. Bleeding: None.  Movement: Present. Denies leaking of fluid/ROM.   The following portions of the patient's history were reviewed and updated as appropriate: allergies, current medications, past family history, past medical history, past social history, past surgical history and problem list. Problem list updated.  Objective:   Vitals:   10/13/19 1603  BP: 108/62  Pulse: 77  Temp: (!) 97.4 F (36.3 C)  Weight: 154 lb 3.2 oz (69.9 kg)    Fetal Status: Fetal Heart Rate (bpm): 150 Fundal Height: 38 cm Movement: Present  Presentation: Vertex  General:  Alert, oriented and cooperative. Patient is in no acute distress.  Skin: Skin is warm and dry. No rash noted.   Cardiovascular: Normal heart rate noted  Respiratory: Normal respiratory effort, no problems with respiration noted  Abdomen: Soft, gravid, appropriate for gestational age.  Pain/Pressure: Absent     Pelvic: Cervical exam deferred        Extremities: Normal range of motion.  Edema: Mild pitting, slight indentation  Mental Status: Normal mood and affect. Normal behavior. Normal judgment and thought content.   Assessment and Plan:  Pregnancy: G3P1011 at 370w4d  1. Supervision of other normal  pregnancy, antepartum -Reviewed 36 wk labs, all wnl. -[facility] discussed when to call. -Ready for baby at home, has carseat. -Reviewed contraceptive plans and patient still desires nothing - declines all BCM pp. -Does not plan on circumcision for infant.    2. Pregnancy affected by fetal growth restriction -Per [facility] U[facility]S 10/10/[facility]= EFW = 16%, AC 6% -Pt met w/[facility] at that visit, per their note:  "Recommendations for an IUP at [place] fetal growth [facility] - antenatal testing: a BPP with repeat dopplers were ordered for next week. The patient should have weekly testing thereafter.  - daily fetal movement count - delivery recommendations (in the absence of other maternal or fetal indications): 38w 0d - 3945w6dr EFW 3 - 10th % tile with normal UA Doppler diastolic evaluation" -Per pt she will be called this week for appt for antenatal testing on Friday and at that time they will decide exact timing of IOL. [facility] and advised to call if not heard of appt time by this Wednesday.  -Advised to continue daily fetal mov't counts, seek medical care if decreased or any concerns.  3. Marginal insertion of umbilical cord affecting management of mother -Per [place] unable to visualize on recent [place]. History of postpartum hemorrhage 05/26/10    Term labor symptoms and general obstetric precautions including but not limited to vaginal bleeding, contractions, leaking of fluid and fetal movement were reviewed in detail with the patient. Please refer to After Visit Summary for other counseling recommendations.  Return in about 1 week (around 10/20/2019) for  routine prenatal care.  Future Appointments  Date Time Provider [place]  10/21/2019  3:40 PM [facility] PROVIDER AC-[facility] None    Kandee Keen, PA-C

## 2019-10-13 NOTE — Progress Notes (Signed)
In for MH RV at 37.4 weeks. Takes PNV and denies ED visit since last RV. Sharlyne Pacas, RN

## 2019-10-21 ENCOUNTER — Other Ambulatory Visit: Payer: Self-pay

## 2019-10-21 ENCOUNTER — Ambulatory Visit: Payer: BC Managed Care – PPO | Admitting: Advanced Practice Midwife

## 2019-10-21 VITALS — BP 116/61 | HR 76 | Temp 97.4°F | Wt 155.0 lb

## 2019-10-21 DIAGNOSIS — O0993 Supervision of high risk pregnancy, unspecified, third trimester: Secondary | ICD-10-CM | POA: Insufficient documentation

## 2019-10-21 DIAGNOSIS — O365990 Maternal care for other known or suspected poor fetal growth, unspecified trimester, not applicable or unspecified: Secondary | ICD-10-CM

## 2019-10-21 DIAGNOSIS — Z3480 Encounter for supervision of other normal pregnancy, unspecified trimester: Secondary | ICD-10-CM

## 2019-10-21 NOTE — Progress Notes (Signed)
Patient here for MH RV at 38 5/7. Patient aware of [facility] appointment on 10/23/19.Marland KitchenBurt Knack, RN

## 2019-10-21 NOTE — Progress Notes (Signed)
   PRENATAL VISIT NOTE  Subjective:  Jessica Savage is a 29 y.o. G3P1011 at [redacted]w[redacted]d being seen today for ongoing prenatal care.  She is currently monitored for the following issues for this high-risk pregnancy and has History of postpartum hemorrhage 05/26/10; PPD positive 2017; Supervision of other normal pregnancy, antepartum; History of breast surgery 2015, 2016; Nausea and vomiting in pregnancy; Encounter for antenatal screening for chromosomal anomalies; Marginal insertion of umbilical cord affecting management of mother; Noncompliance with prenatal care--no care from 07/15/19-09/22/19 (10 weeks); and Pregnancy affected by fetal growth restriction on their problem list.  Patient reports no complaints.  Contractions: Not present. Vag. Bleeding: None.  Movement: Present. Denies leaking of fluid/ROM.   The following portions of the patient's history were reviewed and updated as appropriate: allergies, current medications, past family history, past medical history, past social history, past surgical history and problem list. Problem list updated.  Objective:   Vitals:   10/21/19 1616  BP: 116/61  Pulse: 76  Temp: (!) 97.4 F (36.3 C)  Weight: 155 lb (70.3 kg)    Fetal Status: Fetal Heart Rate (bpm): 160 Fundal Height: 39 cm Movement: Present  Presentation: Vertex  General:  Alert, oriented and cooperative. Patient is in no acute distress.  Skin: Skin is warm and dry. No rash noted.   Cardiovascular: Normal heart rate noted  Respiratory: Normal respiratory effort, no problems with respiration noted  Abdomen: Soft, gravid, appropriate for gestational age.  Pain/Pressure: Absent     Pelvic: Cervical exam deferred        Extremities: Normal range of motion.  Edema: Trace  Mental Status: Normal mood and affect. Normal behavior. Normal judgment and thought content.   Assessment and Plan:  Pregnancy: G3P1011 at [redacted]w[redacted]d  1. Pregnancy affected by fetal growth restriction IUGR with  AC=6% Weekly AP testing. MFM consult on 10/10/19 with plan of: AP testing weekly, daily FM counts, delivery recommended between 38.0-39.6 [facility] u/s on 10/10/19 with AC=6%, EFW=16%, posterior placenta @ 35 4/7, Dopplers wnl, BPP 8/8 10/17/19 BPP 8/8, AFI wnl, u artery dopplers wnl Has [facility] appt 10/23/19. Long discussion with couple re: MFM plan for IUGR.  Couple states they asked [facility] at last apt about IOL and states were told if EFW<10% or AC<10% then no need for IOL.  Encouraged couple to pick IOL date between 38.0 and 39.6 wks but they want to wait until 10/23/19 [facility] apt to discuss with them.    2. Supervision of other high risk pregnancy, antepartum Working 20 hrs/wk Brought in [employer] paperwork they want completed by tomorrow--completed   Term labor symptoms and general obstetric precautions including but not limited to vaginal bleeding, contractions, leaking of fluid and fetal movement were reviewed in detail with the patient. Please refer to After Visit Summary for other counseling recommendations.  Return in about 1 week (around 10/28/2019) for routine PNC.  Future Appointments  Date Time Provider Department Center  10/28/2019  3:40 PM [facility] PROVIDER AC-[facility] None    Alberteen Spindle, CNM

## 2019-10-22 ENCOUNTER — Telehealth: Payer: Self-pay

## 2019-10-22 NOTE — Telephone Encounter (Signed)
[facility] IOL referral faxed with confirmation received. Call to [facility] scheduler) and left message requesting IOL date. Number to call provided. Jossie Ng, RN

## 2019-10-23 ENCOUNTER — Telehealth: Payer: Self-pay

## 2019-10-23 NOTE — Telephone Encounter (Signed)
TC to patient to inform of [facility] IOL 10/24/2019, tomorrow. Patient counseled to be waiting for phone call from [facility] tomorrow morning between 7am-11am. Patient to be packed and ready to go when she receives call. Apology made to patient for short notice, but this TC was made right after call from [facility] with IOL date. Patient asked if she should go to U/S today, patient told yes. Also, patient informed her FMLA paperwork is ready to be picked up any time today until 5pm. Interpreter V. Olmedo.Burt Knack, RN

## 2019-10-25 DIAGNOSIS — Z3480 Encounter for supervision of other normal pregnancy, unspecified trimester: Secondary | ICD-10-CM

## 2019-10-28 ENCOUNTER — Ambulatory Visit: Payer: Self-pay

## 2020-01-06 ENCOUNTER — Telehealth: Payer: Self-pay

## 2020-01-06 NOTE — Telephone Encounter (Signed)
TC to patient to schedule PP visit. LM with number to call. [address] [place] ID# [id number].Marland KitchenBurt Knack, RN

## 2020-01-07 NOTE — Telephone Encounter (Signed)
Call to client to schedule post-partum appt using [employer] (ID [phone]). Per interpreter, call answered but no one spoke and then call disconnected. Interpreter re-dialed number and client answered phone. Per client, she is in [place] and will remain there for the next 3 weeks, possibly longer. However, client thinks she will return to [facility] before Christmas. Post-partum appt scheduled for 02/11/2020. Per client, no birth control method desired post-partum. Jossie Ng, RN

## 2020-02-11 ENCOUNTER — Telehealth: Payer: Self-pay

## 2020-02-11 ENCOUNTER — Ambulatory Visit: Payer: Self-pay

## 2020-02-11 NOTE — Telephone Encounter (Signed)
[facility] for post-partum exam as scheduled on 02/11/2020. Call to client with [facility] [employer] (ID # [phone]) to reschedule appt. Left message to call and reschedule appt and number to call provided. Jossie Ng, RN

## 2020-02-19 NOTE — Telephone Encounter (Signed)
Call to client to reschedule missed post-partum appt on 02/11/2020. Left message to call with number to call provided. [name], [facility] interpreter assisted with call. Jossie Ng, RN

## 2020-02-26 NOTE — Telephone Encounter (Signed)
Phone call to pt with interpreter, [name]. Left message with gentleman that answered phone that Jessica Savage's dr office is just trying to reschedule missed appt. [name] stated to RN that [name] is to call [facility] to reschedule missed appt, and RN confirmed yes.

## 2020-04-21 IMAGING — US US MFM OB COMP +14 WKS
1 series · 12 of 28 positions shown · non-contrast
Comparison: none

PATIENT INFO:

PERFORMED BY:
                                                            JINGHU
SERVICE(S) PROVIDED:
 ----------------------------------------------------------------------
INDICATIONS:
  18 weeks gestation of pregnancy
FETAL EVALUATION:
 Num Of Fetuses:         1
 Fetal Heart Rate(bpm):  153
 Cardiac Activity:       Present
 Presentation:           Transverse, spine up, head right
 Placenta:               Posterior Grade , No previa
BIOMETRY:
 BPD:      37.5  mm     G. Age:  17w 3d         26  %    CI:         68.2   %    70 - 86
                                                         FL/HC:      17.6   %    15.8 - 18
 HC:      145.2  mm     G. Age:  17w 5d         28  %    HC/AC:      1.07        1.07 -
 AC:      135.9  mm     G. Age:  19w 0d         79  %    FL/BPD:     68.3   %
 FL:       25.6  mm     G. Age:  17w 5d         36  %    FL/AC:      18.8   %    20 - 24
 HUM:      24.5  mm     G. Age:  17w 5d         44  %
 NFT:         3  mm
 CM:        3.7  mm
 Est. FW:     235  gm      0 lb 8 oz     63  %
GESTATIONAL AGE:
 LMP:           18w 0d        Date:  01/23/19                 EDD:   10/30/19
 Clinical EDD:  18w 0d                                        EDD:   10/30/19
 U/S Today:     18w 0d                                        EDD:   10/30/19
 Best:          18w 0d     Det. By:  LMP  (01/23/19)          EDD:   10/30/19
ANATOMY:
 Cavum:                 CSP visualized         Aortic Arch:            Normal appearance
 Ventricles:            Normal appearance      Ductal Arch:            Normal appearance
 Choroid Plexus:        Within Normal Limits   Diaphragm:              Within Normal Limits
 Cerebellum:            Within Normal Limits   Stomach:                Seen
 Posterior Fossa:       Within Normal Limits   Abdomen:                Within Normal
                                                                       Limits
 Nuchal Fold:           Within Normal Limits   Abdominal Wall:         Normal appearance
 Face:                  Orbits visualized      Cord Vessels:           3 vessels
 Lips:                  Normal appearance      Kidneys:                Normal appearance
 Thoracic:              Within Normal Limits   Bladder:                Seen
 Heart:                 4-Chamber view         Spine:                  Normal appearance
                        appears normal
 RVOT:                  Normal appearance      Upper Extremities:      Visualized
 LVOT:                  Normal appearance      Lower Extremities:      Visualized
CERVIX UTERUS ADNEXA:
 Cervix
 Length:           5.08  cm.

[Series 1: us mfm ob comp +14 wks · 0.22mm/px · 12 of 83 slices shown]
[im 4/83]
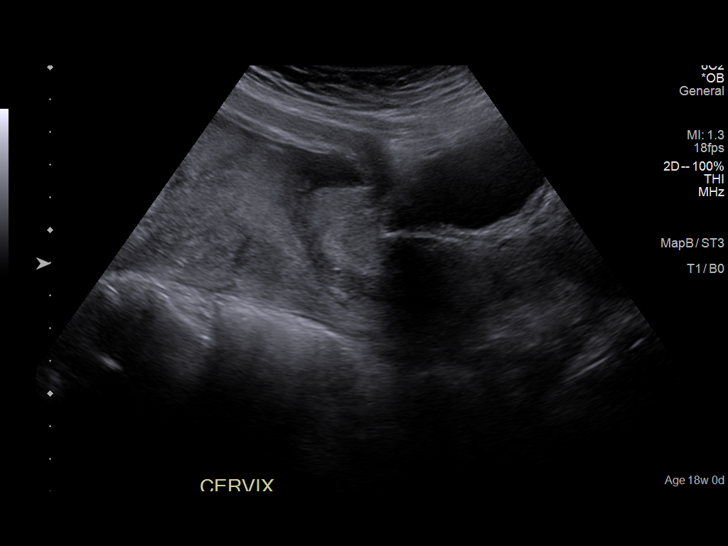
[im 10/83]
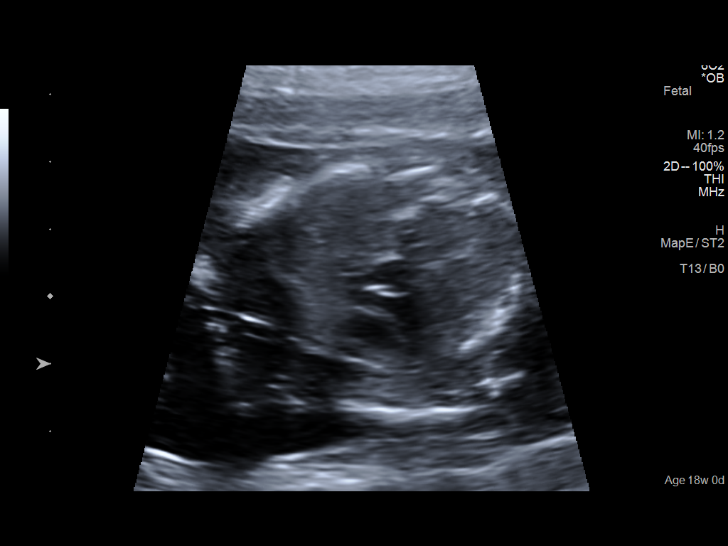
[im 16/83]
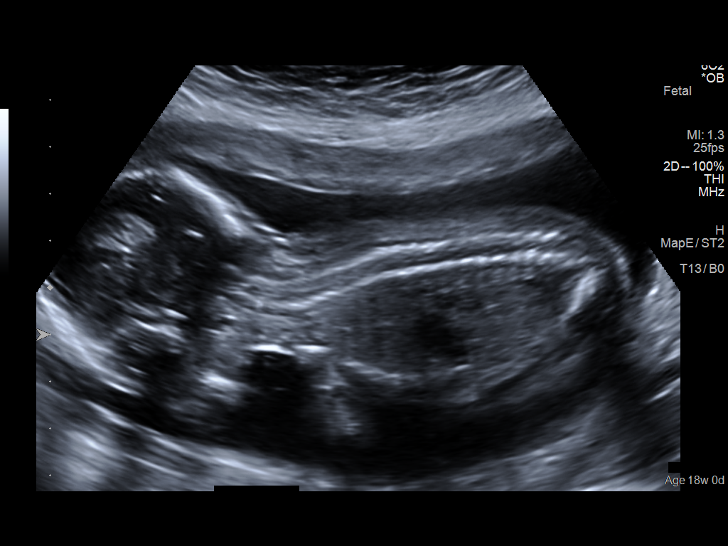
[im 25/83]
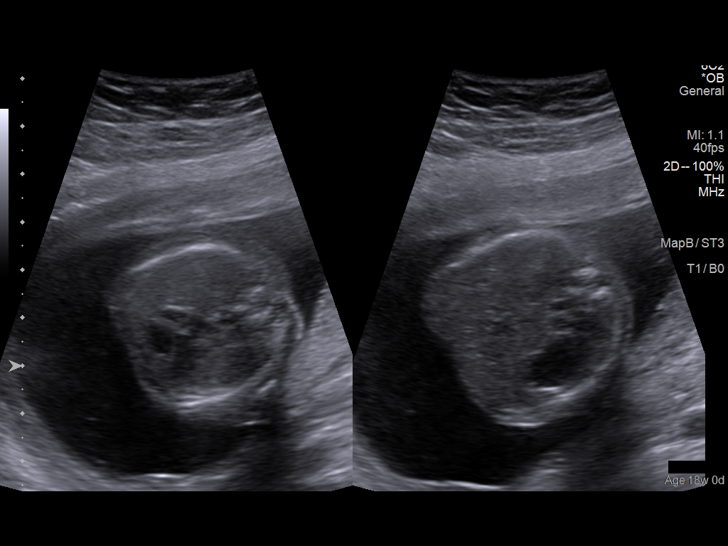
[im 31/83]
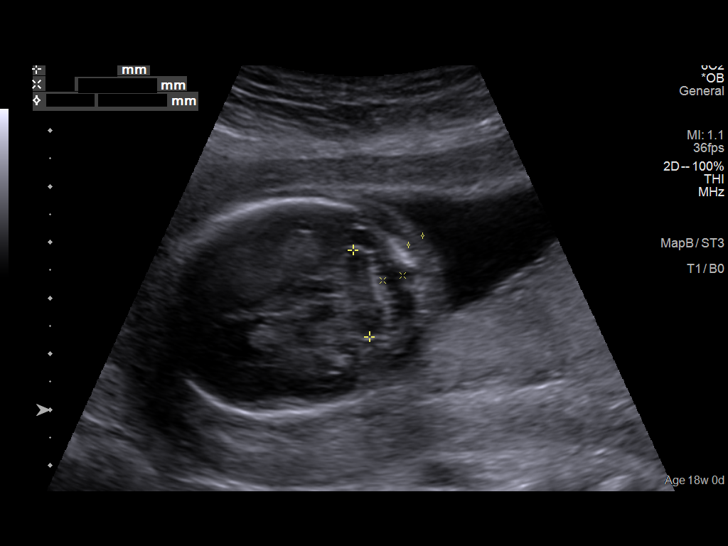
[im 37/83]
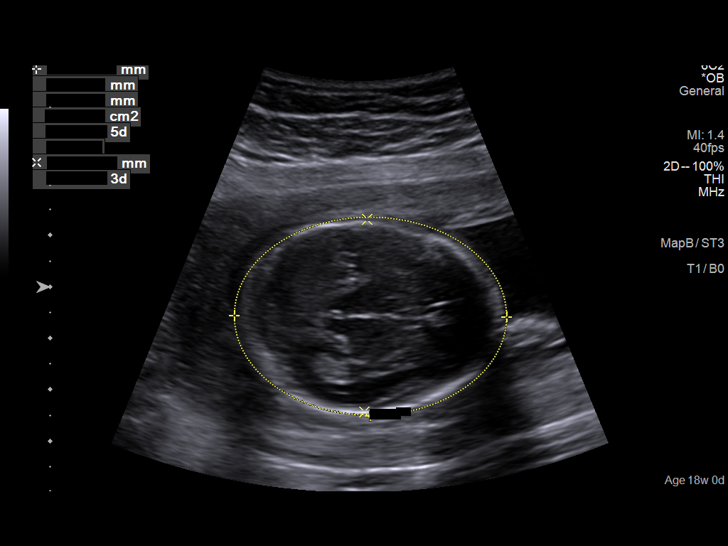
[im 46/83]
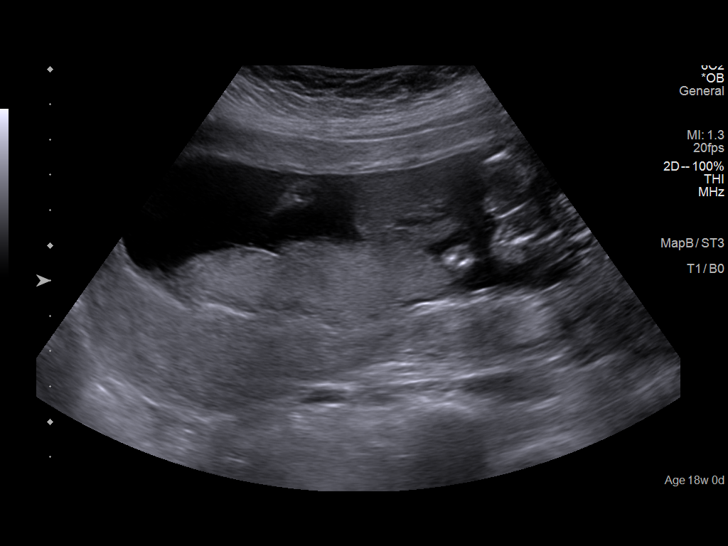
[im 52/83]
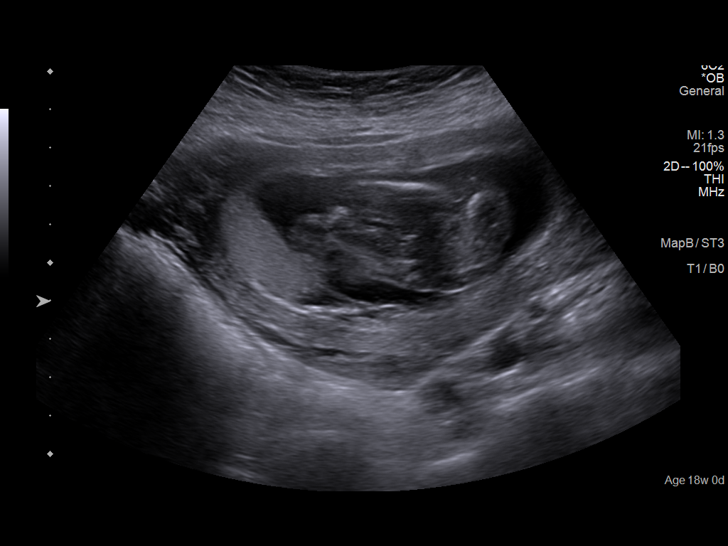
[im 58/83]
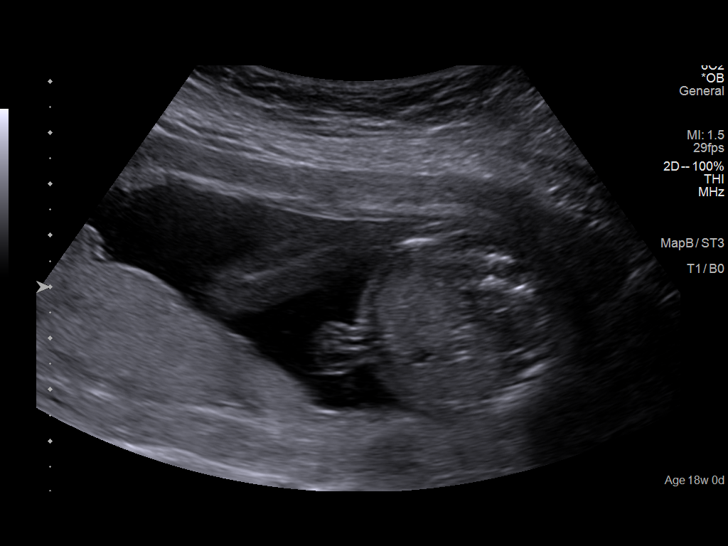
[im 67/83]
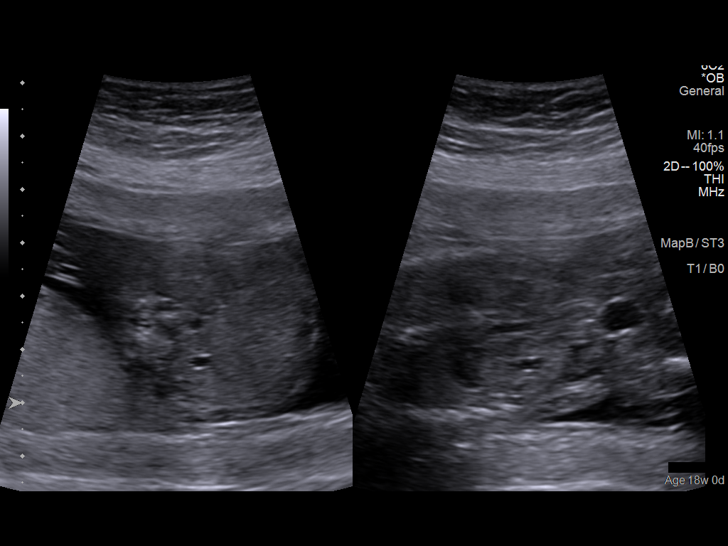
[im 73/83]
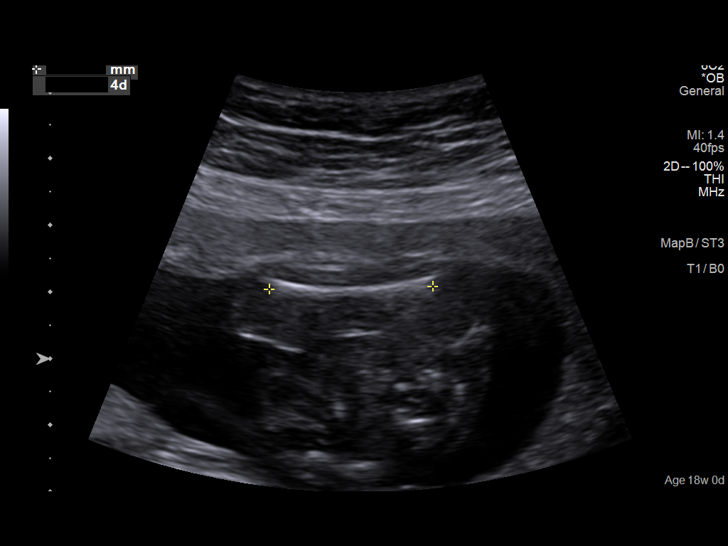
[im 79/83]
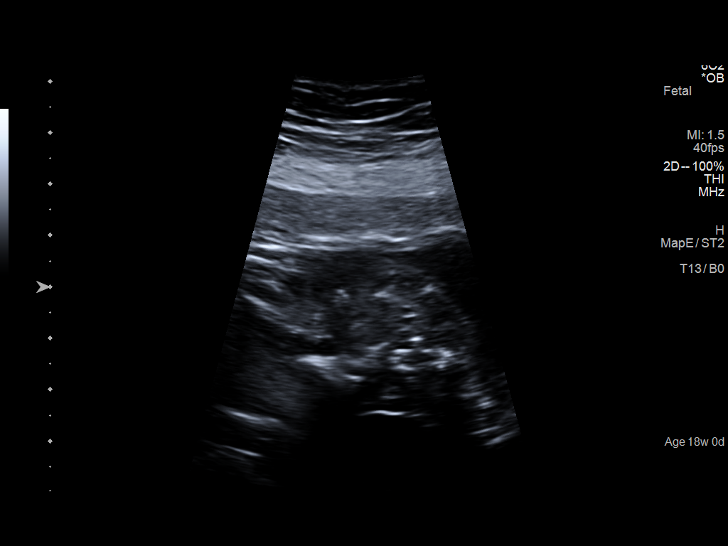

[12 of 28 positions shown; findings below may reference images not displayed]

IMPRESSION: Dear Dr.   JINGHU,

 Thank you for referring your patient for a fetal anatomical
 survey.

 The SEMOKO is 18 weeks by known LMP and today's scan .

 There is a singleton gestation with subjectively normal
 amniotic fluid volume.

 Routine evaluation of the fetal anatomy was performed.  The
 fetal anatomical survey appears within normal limits within
 the resolution of ultrasound as described above.
 Marginal cord insertion noted at fundal portion of placenta -
 I offered growth scan as marginal cord insertion has been
 associated with less than expected fetal growth - scheduled
 in 14 weeks (  third trimester )
 Thank you for allowing us to participate in your patient's care.

 assistance.

## 2020-07-13 ENCOUNTER — Telehealth: Payer: Self-pay | Admitting: Family Medicine

## 2020-07-13 NOTE — Telephone Encounter (Signed)
Call to client with Interpreter Hedwig Morton. Per client, delivered 10/25/2019 and missed 02/11/2020 post-partum appt. Client scheduled [facility] appt (greater than 6 months since delivery). Per client, unsure of desired BCM, but not interested in a LARC at this time. Client states no menses x2 months with negative UPT x3 at home. As reports last intercourse 1 week ago, counseled to remain abstinent until 07/20/2020 appt so if UPT ordered, results should be accurate as will be ~ 14 days since last intercourse. Client verbalized understanding. Jossie Ng, RN

## 2020-07-13 NOTE — Telephone Encounter (Signed)
Patient's husband called to schedule a post partum appointment for her. Said she missed it and was also wondering about her menstrual cycle since it's been 2 months since she's had one. Let him know that since her post partum was due back in Dec that I'd leave a note for the nurse to see if she can still be seen here or she might need to see a pcp. Her number is updated on her profile. Thank you!!!

## 2020-07-20 ENCOUNTER — Other Ambulatory Visit: Payer: Self-pay

## 2020-07-20 ENCOUNTER — Ambulatory Visit (LOCAL_COMMUNITY_HEALTH_CENTER): Payer: Self-pay | Admitting: Advanced Practice Midwife

## 2020-07-20 VITALS — BP 103/62 | Ht 61.6 in | Wt 138.4 lb

## 2020-07-20 DIAGNOSIS — Z01419 Encounter for gynecological examination (general) (routine) without abnormal findings: Secondary | ICD-10-CM

## 2020-07-20 DIAGNOSIS — E663 Overweight: Secondary | ICD-10-CM | POA: Insufficient documentation

## 2020-07-20 DIAGNOSIS — Z3009 Encounter for other general counseling and advice on contraception: Secondary | ICD-10-CM

## 2020-07-20 LAB — WET PREP FOR TRICH, YEAST, CLUE: Trichomonas Exam: NEGATIVE

## 2020-07-20 LAB — PREGNANCY, URINE: Preg Test, Ur: NEGATIVE

## 2020-07-20 LAB — HEMOGLOBIN, FINGERSTICK: Hemoglobin: 10.6 g/dL — ABNORMAL LOW (ref 11.1–15.9)

## 2020-07-20 MED ORDER — MEDROXYPROGESTERONE ACETATE 10 MG PO TABS: 10.0000 mg | ORAL_TABLET | Freq: Every day | ORAL | 0 refills | Status: AC

## 2020-07-20 NOTE — Progress Notes (Signed)
Pt here for PE.  Wet mount results reviewed, no medication needed.  Pt declined condoms. Berdie Ogren, RN

## 2020-07-20 NOTE — Progress Notes (Signed)
[facility] [address] Main Number: [phone]    Family Planning Visit- Initial Visit  Subjective:  Jessica Savage is a 30 y.o. MHF G3P2012 (10, 39 mo old) nonsmoker  being seen today for an initial annual visit and to discuss conceiving.  The patient is currently using None for pregnancy prevention. Patient reports she does want a pregnancy in the next year.  Patient has the following medical conditions has History of postpartum hemorrhage 05/26/10; PPD positive 2017; Supervision of other normal pregnancy, antepartum; History of breast surgery 2015, 2016; Nausea and vomiting in pregnancy; Encounter for antenatal screening for chromosomal anomalies; Marginal insertion of umbilical cord affecting management of mother; Noncompliance with prenatal care--no care from 07/15/19-09/22/19 (10 weeks); Pregnancy affected by fetal growth restriction; and Supervision of high risk pregnancy in third trimester on their problem list.  Chief Complaint  Patient presents with  . Annual Exam    PE    Patient reports SVD 10/25/2019 and breastfed that infant x 4 mo. States has had menses monthly pp with LMP 04/30/20 and none since. Wants to conceive. States has never had this problem before. Last sex 07/07/20 with condom; with current partner x 11 years; 1 partner in last 3 mo. Last pap 07/18/17 neg. Last ETOH 06/26/20 (1 Margarita) rarely. Did not come for pp exam. Not working. Living with husband and 2 children.  Patient denies cigs, vaping, cigars, MJ  Body mass index is 25.64 kg/m. - Patient is eligible for diabetes screening based on BMI and age >30?  not applicable HA1C ordered? not applicable  Patient reports 1  partner/s in last year. Desires STI screening?  Yes  Has patient been screened once for HCV in the past?  No  No results found for: HCVAB  Does the patient have current drug use (including MJ), have a partner with drug use, and/or has  been incarcerated since last result? No  If yes-- Screen for HCV through [facility]   Does the patient meet criteria for HBV testing? No  Criteria:  -Household, sexual or needle sharing contact with HBV -History of drug use -HIV positive -Those with known Hep C   Health Maintenance Due  Topic Date Due  . Hepatitis C Screening  Never done  . TETANUS/TDAP  Never done  . PAP SMEAR-Modifier  07/18/2020    Review of Systems  All other systems reviewed and are negative.   The following portions of the patient's history were reviewed and updated as appropriate: allergies, current medications, past family history, past medical history, past social history, past surgical history and problem list. Problem list updated.   See flowsheet for other program required questions.  Objective:   Vitals:   07/20/20 0940  BP: 103/62  Weight: 138 lb 6.4 oz (62.8 kg)  Height: 5' 1.6" (1.565 m)    Physical Exam Constitutional:      Appearance: Normal appearance. She is normal weight.  HENT:     Head: Normocephalic and atraumatic.     Mouth/Throat:     Mouth: Mucous membranes are moist.     Comments: Last dental exam 2021; urged to have exam asap Eyes:     Conjunctiva/sclera: Conjunctivae normal.  Neck:     Comments: Thyroid without masses or enlargement Cardiovascular:     Rate and Rhythm: Normal rate and regular rhythm.  Pulmonary:     Effort: Pulmonary effort is normal.     Breath sounds: Normal breath  sounds.  Chest:  Breasts:     Right: Normal.     Left: Normal.    Abdominal:     Palpations: Abdomen is soft.     Comments: Soft without masses or tenderness  Genitourinary:    General: Normal vulva.     Exam position: Lithotomy position.     Vagina: Vaginal discharge (white creamy leukorrhea, ph<4.5) present.     Cervix: Normal.     Uterus: Normal.      Adnexa: Right adnexa normal and left adnexa normal.     Rectum: Normal.     Comments: Pap done Musculoskeletal:         General: Normal range of motion.     Cervical back: Normal range of motion and neck supple.  Skin:    General: Skin is warm and dry.  Neurological:     Mental Status: She is alert.  Psychiatric:        Mood and Affect: Mood normal.       Assessment and Plan:  Jessica Savage is a 30 y.o. female presenting to the [facility] for an initial annual wellness/contraceptive visit  Contraception counseling: Reviewed all forms of birth control options in the tiered based approach. available including abstinence; over the counter/barrier methods; hormonal contraceptive medication including pill, patch, ring, injection,contraceptive implant, ECP; hormonal and nonhormonal IUDs; permanent sterilization options including vasectomy and the various tubal sterilization modalities. Risks, benefits, and typical effectiveness rates were reviewed.  Questions were answered.  Written information was also given to the patient to review.  Patient desires to conceive, this was prescribed for patient. She will follow up in  prn for surveillance.  She was told to call with any further questions, or with any concerns about this method of contraception.  Emphasized use of condoms 100% of the time for STI prevention.  Patient was offered ECP. ECP was not accepted by the patient. ECP counseling was not given - see RN documentation  1. Well woman exam with routine gynecological exam If PT neg today, pt desires Provera 10 mg  X 5 days; should have withdrawal bleeding 7 days after completing Provera. If no bleeding, pt will need referral to OB/Gyn. Pt declines ocp's Treat wet mount per standing orders Immunization nurse consult - WET PREP FOR TRICH, YEAST, CLUE - Pregnancy, urine - Hemoglobin, venipuncture - Chlamydia/Gonorrhea [place] [facility] - Syphilis Serology, [place] [facility] - HIV [place] [facility] - IGP, Aptima HPV     No follow-ups on file.  No future appointments.  Alberteen Spindle, CNM

## 2020-07-22 LAB — HM HIV SCREENING LAB: HM HIV Screening: NEGATIVE

## 2020-07-25 LAB — IGP, APTIMA HPV
HPV Aptima: NEGATIVE
PAP Smear Comment: 0

## 2020-09-02 IMAGING — CR DG CHEST 1V
1 series · 1 of 1 positions shown · non-contrast
Comparison: Radiograph 02/02/2015

CLINICAL DATA: Nonspecific reaction to cell mediated immunity
measurement of gamma interferon antigen response without active
tuberculosis

Patient reports positive TB test 3 years ago. Follow-up.
Asymptomatic.
EXAM:
CHEST  1 VIEW

[dg chest 1 view]
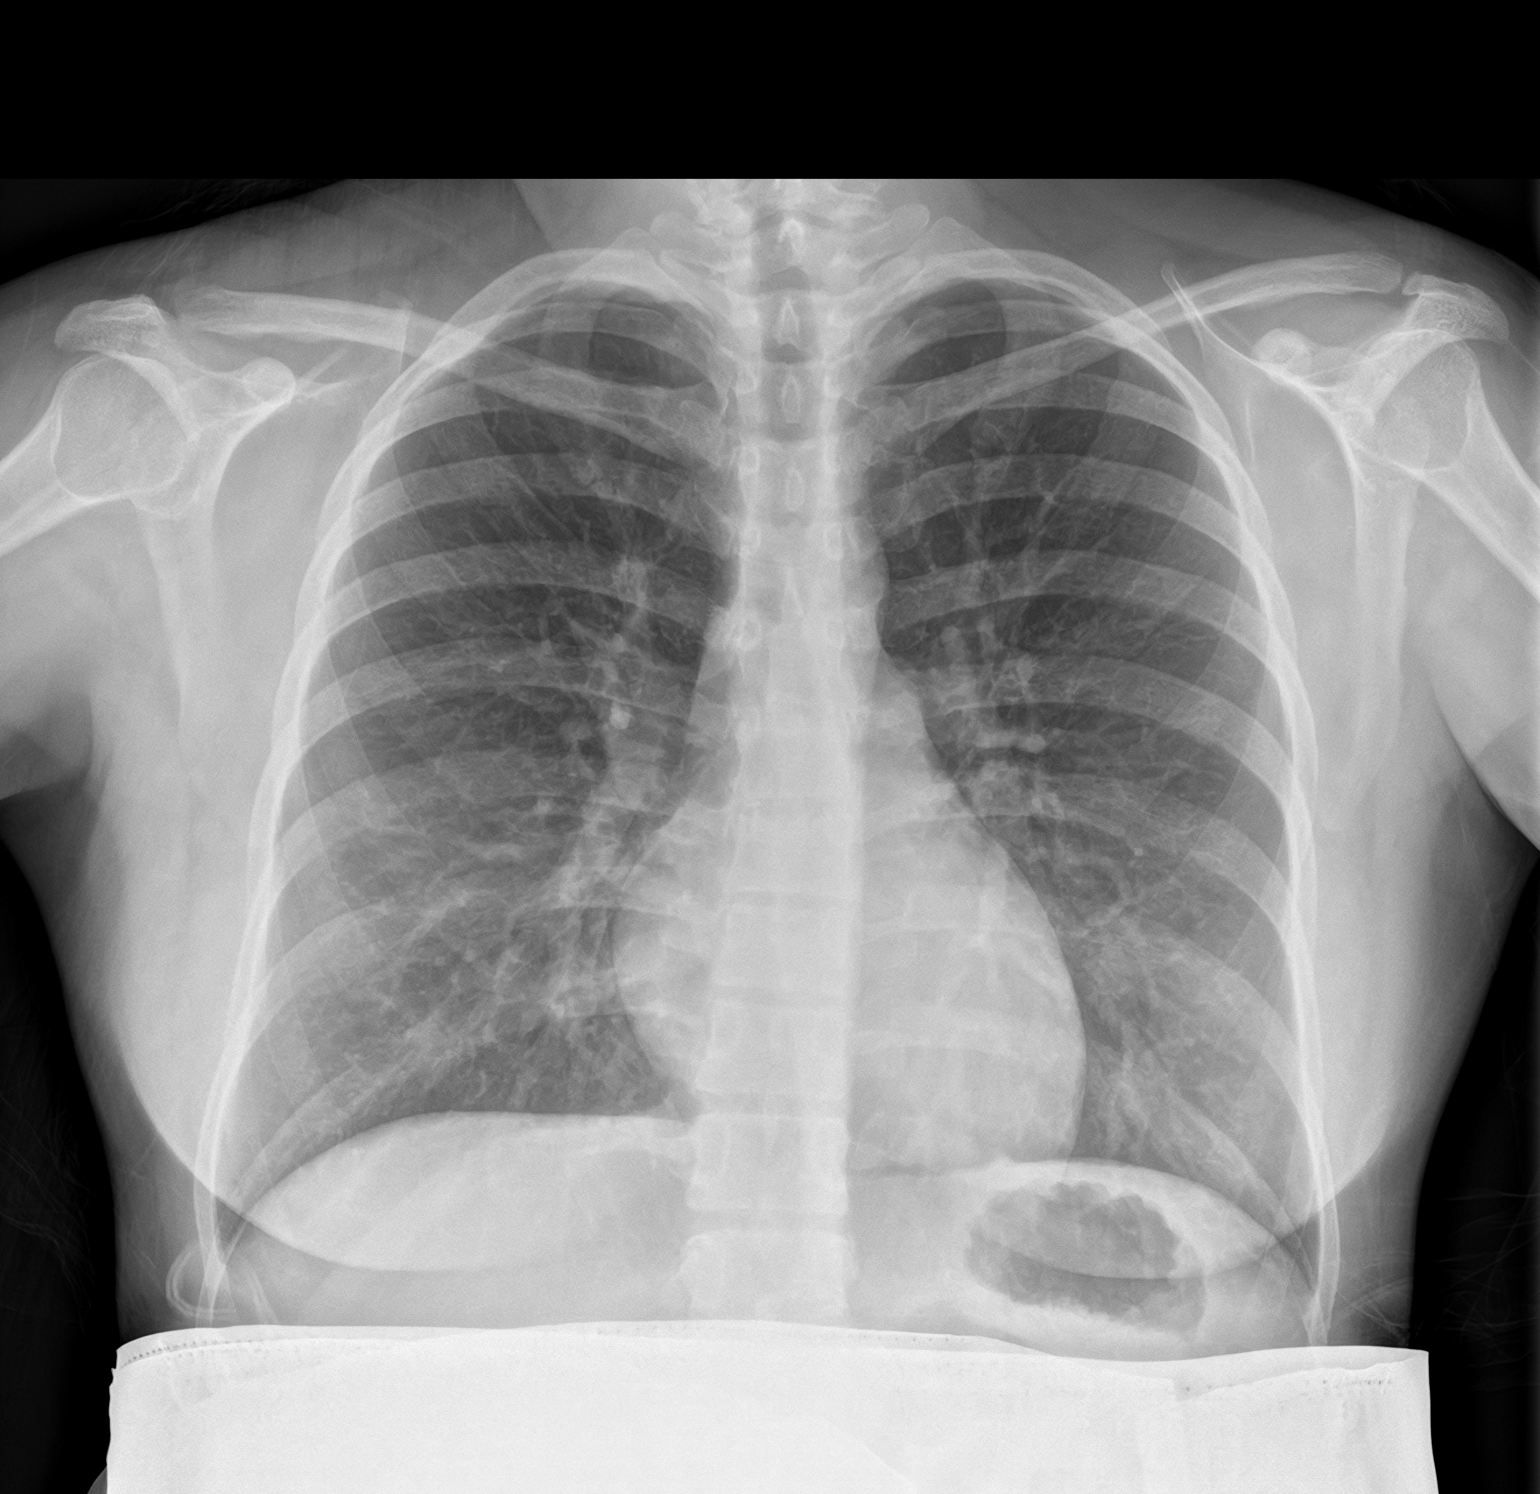

[1 of 1 positions shown; findings below may reference images not displayed]

FINDINGS: Prior calcified granuloma in the left upper lobe is again seen.
There is a possible calcified granuloma in the right lung.The
cardiomediastinal contours are normal. The lungs are clear.
Pulmonary vasculature is normal. No consolidation, pleural effusion,
or pneumothorax. No acute osseous abnormalities are seen.
IMPRESSION: No acute chest findings. Prior granulomatous disease with calcified
granuloma in the left and possibly right lung.

## 2022-04-03 ENCOUNTER — Encounter: Payer: Self-pay | Admitting: Intensive Care

## 2022-04-03 ENCOUNTER — Emergency Department: Payer: Self-pay

## 2022-04-03 ENCOUNTER — Emergency Department
Admission: EM | Admit: 2022-04-03 | Discharge: 2022-04-03 | Disposition: A | Discharge status: Home / Self Care | Payer: Self-pay | Attending: Emergency Medicine | Admitting: Emergency Medicine

## 2022-04-03 ENCOUNTER — Other Ambulatory Visit: Payer: Self-pay

## 2022-04-03 DIAGNOSIS — B349 Viral infection, unspecified: Secondary | ICD-10-CM | POA: Insufficient documentation

## 2022-04-03 DIAGNOSIS — R509 Fever, unspecified: Secondary | ICD-10-CM

## 2022-04-03 DIAGNOSIS — R1084 Generalized abdominal pain: Secondary | ICD-10-CM

## 2022-04-03 LAB — CBC
HCT: 29.5 % — ABNORMAL LOW (ref 36.0–46.0)
Hemoglobin: 8.6 g/dL — ABNORMAL LOW (ref 12.0–15.0)
MCH: 20.1 pg — ABNORMAL LOW (ref 26.0–34.0)
MCHC: 29.2 g/dL — ABNORMAL LOW (ref 30.0–36.0)
MCV: 69.1 fL — ABNORMAL LOW (ref 80.0–100.0)
Platelets: 275 10*3/uL (ref 150–400)
RBC: 4.27 MIL/uL (ref 3.87–5.11)
RDW: 19.6 % — ABNORMAL HIGH (ref 11.5–15.5)
WBC: 4.5 10*3/uL (ref 4.0–10.5)
nRBC: 0 % (ref 0.0–0.2)

## 2022-04-03 LAB — COMPREHENSIVE METABOLIC PANEL
ALT: 127 U/L — ABNORMAL HIGH (ref 0–44)
AST: 80 U/L — ABNORMAL HIGH (ref 15–41)
Albumin: 4.1 g/dL (ref 3.5–5.0)
Alkaline Phosphatase: 131 U/L — ABNORMAL HIGH (ref 38–126)
Anion gap: 8 (ref 5–15)
BUN: 13 mg/dL (ref 6–20)
CO2: 22 mmol/L (ref 22–32)
Calcium: 8.4 mg/dL — ABNORMAL LOW (ref 8.9–10.3)
Chloride: 105 mmol/L (ref 98–111)
Creatinine, Ser: 0.72 mg/dL (ref 0.44–1.00)
GFR, Estimated: >60 mL/min (ref >60)
Glucose, Bld: 111 mg/dL — ABNORMAL HIGH (ref 70–99)
Potassium: 3.7 mmol/L (ref 3.5–5.1)
Sodium: 135 mmol/L (ref 135–145)
Total Bilirubin: 0.9 mg/dL (ref 0.3–1.2)
Total Protein: 7.9 g/dL (ref 6.5–8.1)

## 2022-04-03 LAB — URINALYSIS, ROUTINE W REFLEX MICROSCOPIC
Bacteria, UA: NONE SEEN
Bilirubin Urine: NEGATIVE
Glucose, UA: NEGATIVE mg/dL
Ketones, ur: 5 mg/dL — AB
Nitrite: NEGATIVE
Protein, ur: 30 mg/dL — AB
Specific Gravity, Urine: >1.046 — ABNORMAL HIGH (ref 1.005–1.030)
pH: 5 (ref 5.0–8.0)

## 2022-04-03 LAB — LIPASE, BLOOD: Lipase: 32 U/L (ref 11–51)

## 2022-04-03 LAB — LACTIC ACID, PLASMA: Lactic Acid, Venous: 1.4 mmol/L (ref 0.5–1.9)

## 2022-04-03 MED ORDER — IOHEXOL 300 MG/ML  SOLN
100.0000 mL | Freq: Once | INTRAMUSCULAR | Status: AC | PRN
  Administered 2022-04-03: 100 mL via INTRAVENOUS

## 2022-04-03 MED ORDER — ACETAMINOPHEN 325 MG PO TABS
650.0000 mg | ORAL_TABLET | Freq: Once | ORAL | Status: AC | PRN
  Administered 2022-04-03: 650 mg via ORAL
  Filled 2022-04-03: qty 2

## 2022-04-03 NOTE — ED Provider Notes (Signed)
[facility] Provider Note   Event Date/Time   First MD Initiated Contact with Patient 04/03/22 1013     (approximate) History  Abdominal Pain and Fever  HPI Jessica Savage is a 32 y.o. female who presents from urgent care with abdominal pain, fever, and bilateral ear pain with concerns for possible appendicitis versus intra-abdominal infection.  Patient states that she has had recent sick contacts through her work and at home.  Patient denies any vomiting/diarrhea.  Patient denies fever ROS: Patient currently denies any vision changes, tinnitus, difficulty speaking, facial droop, sore throat, chest pain, shortness of breath, abdominal pain, dysuria, or weakness/numbness/paresthesias in any extremity   Physical Exam  Triage Vital Signs: ED Triage Vitals  Enc Vitals Group     BP 04/03/22 1005 (!) 113/57     Pulse Rate 04/03/22 1005 (!) 118     Resp 04/03/22 1005 18     Temp 04/03/22 1005 (!) 102.8 F (39.3 C)     Temp Source 04/03/22 1005 Oral     SpO2 04/03/22 1005 100 %     Weight 04/03/22 1006 145 lb (65.8 kg)     Height 04/03/22 1006 5' 2"$  (1.575 m)     Head Circumference --      Peak Flow --      Pain Score 04/03/22 1006 5     Pain Loc --      Pain Edu? --      Excl. in [place]? --    Most recent vital signs: Vitals:   04/03/22 1005 04/03/22 1059  BP: (!) 113/57   Pulse: (!) 118   Resp: 18   Temp: (!) 102.8 F (39.3 C) 99.7 F (37.6 C)  SpO2: 100%    General: Awake, oriented x4. CV:  Good peripheral perfusion.  Resp:  Normal effort.  Abd:  No distention.  Nontender Other:  Middle-aged overweight Hispanic female laying in bed in no acute distress ED Results / Procedures / Treatments  Labs (all labs ordered are listed, but only abnormal results are displayed) Labs Reviewed  COMPREHENSIVE METABOLIC PANEL - Abnormal; Notable for the following components:      Result Value   Glucose, Bld 111 (*)    Calcium 8.4 (*)    AST 80 (*)    ALT  127 (*)    Alkaline Phosphatase 131 (*)    All other components within normal limits  CBC - Abnormal; Notable for the following components:   Hemoglobin 8.6 (*)    HCT 29.5 (*)    MCV 69.1 (*)    MCH 20.1 (*)    MCHC 29.2 (*)    RDW 19.6 (*)    All other components within normal limits  URINALYSIS, ROUTINE W REFLEX MICROSCOPIC - Abnormal; Notable for the following components:   Color, Urine YELLOW (*)    APPearance HAZY (*)    Specific Gravity, Urine >1.046 (*)    Hgb urine dipstick SMALL (*)    Ketones, ur 5 (*)    Protein, ur 30 (*)    Leukocytes,Ua SMALL (*)    All other components within normal limits  LIPASE, BLOOD  LACTIC ACID, PLASMA  PREGNANCY, URINE   RADIOLOGY ED MD interpretation: CT of the abdomen and pelvis with IV contrast interpreted independently by me shows moderate central mesenteric stranding with prominent lymph nodes as well as moderate colonic stool. -Agree with radiology assessment Official radiology report(s): CT Abdomen Pelvis W Contrast  Result Date: 04/03/2022  CLINICAL DATA:  Abdominal pain EXAM: CT ABDOMEN AND PELVIS WITH CONTRAST TECHNIQUE: Multidetector CT imaging of the abdomen and pelvis was performed using the standard protocol following bolus administration of intravenous contrast. RADIATION DOSE REDUCTION: This exam was performed according to the departmental dose-optimization program which includes automated exposure control, adjustment of the mA and/or kV according to patient size and/or use of iterative reconstruction technique. CONTRAST:  1105m OMNIPAQUE IOHEXOL 300 MG/ML  SOLN COMPARISON:  None Available. FINDINGS: Lower chest: Linear opacity lung bases likely scar or atelectasis. No pleural effusion. Hepatobiliary: No space-occupying liver lesion. Patent portal vein. Gallbladder is not seen. Pancreas: Unremarkable. No pancreatic ductal dilatation or surrounding inflammatory changes. Spleen: Normal in size without focal abnormality.  Adrenals/Urinary Tract: Adrenal glands are preserved. No enhancing renal mass or collecting system dilatation. Ureters have normal course and caliber down to the bladder. Bladder is underdistended. Stomach/Bowel: Moderate colonic stool. Large bowel overall has a normal course and caliber. Normal appendix extends medial to the cecum in the right lower quadrant. Stomach is nondilated with some luminal fluid. Small bowel is nondilated. Vascular/Lymphatic: Normal caliber aorta and IVC. There is a replaced left hepatic artery from the left gastric. Normal variant. There is moderate central mesenteric stranding with some prominent lymph nodes. No retroperitoneal lymph node enlargement. Reproductive: Uterus and bilateral adnexa are unremarkable. Other: No ascites. No pelvic or anterior abdominal wall hernia. Only a small fat containing umbilical hernia. Musculoskeletal: No acute or significant osseous findings. IMPRESSION: 1. Moderate central mesenteric stranding with some prominent lymph nodes. Findings are nonspecific but can be seen in the setting of mesenteric panniculitis. Recommend six-month follow-up CT if there is no prior. 2. Moderate colonic stool. Normal appendix. No free air or free fluid. Electronically Signed   By: AJill SideM.D.   On: 04/03/2022 11:26   DG Chest 2 View  Result Date: 04/03/2022 CLINICAL DATA:  Fever and body pain.  Infection. EXAM: CHEST - 2 VIEW COMPARISON:  Chest radiographs 10/10/2019, 02/10/2015 FINDINGS: Cardiac silhouette and mediastinal contours are within normal limits. The lungs are clear. No pleural effusion or pneumothorax. No acute skeletal abnormality. IMPRESSION: No active cardiopulmonary disease. Electronically Signed   By: RYvonne KendallM.D.   On: 04/03/2022 10:53   PROCEDURES: Critical Care performed: No .1-3 Lead EKG Interpretation  Performed by: BNaaman Plummer MD Authorized by: BNaaman Plummer MD     Interpretation: normal     ECG rate:  71   ECG rate  assessment: normal     Rhythm: sinus rhythm     Ectopy: none     Conduction: normal    MEDICATIONS ORDERED IN ED: Medications  acetaminophen (TYLENOL) tablet 650 mg (650 mg Oral Given 04/03/22 1016)  iohexol (OMNIPAQUE) 300 MG/ML solution 100 mL (100 mLs Intravenous Contrast Given 04/03/22 1112)   IMPRESSION / MDM / ASSESSMENT AND PLAN / ED COURSE  I reviewed the triage vital signs and the nursing notes.                             The patient is on the cardiac monitor to evaluate for evidence of arrhythmia and/or significant heart rate changes. Patient's presentation is most consistent with acute presentation with potential threat to life or bodily function. Patients symptoms not typical for emergent causes of abdominal pain such as, but not limited to, appendicitis, abdominal aortic aneurysm, surgical biliary disease, pancreatitis, SBO, mesenteric ischemia, serious intra-abdominal bacterial illness. Presentation  also not typical of gynecologic emergencies such as TOA, Ovarian Torsion, PID. Not Ectopic. Doubt atypical ACS.  Pt tolerating PO. Disposition: Patient will be discharged with strict return precautions and follow up with primary MD within 12-24 hours for further evaluation. Patient understands that this still may have an early presentation of an emergent medical condition such as appendicitis that will require a recheck.   FINAL CLINICAL IMPRESSION(S) / ED DIAGNOSES   Final diagnoses:  Generalized abdominal pain  Fever, unspecified fever cause  Viral syndrome   Rx / DC Orders   ED Discharge Orders     None      Note:  This document was prepared using Dragon voice recognition software and may include unintentional dictation errors.   Naaman Plummer, MD 04/03/22 1534

## 2022-04-03 NOTE — ED Triage Notes (Signed)
Pt c/o fever, body aches, bilateral ear pain X3 days. [facility] sent patient here for protein in the urine. Also having abdominal and lower back pain.   Flu/covid/strep/pregnancy test at [facility] negative
